# Patient Record
Sex: Female | Born: 1985 | Race: Black or African American | Hispanic: No | Marital: Single | Smoking: Former smoker
Health system: Southern US, Community
[De-identification: ages and names within clinical notes are randomized; demographics above are authoritative.]

## PROBLEM LIST (undated history)

## (undated) ENCOUNTER — Emergency Department (HOSPITAL_COMMUNITY): Admission: EM | Payer: Self-pay | Source: Home / Self Care

## (undated) ENCOUNTER — Inpatient Hospital Stay (HOSPITAL_COMMUNITY): Payer: Self-pay

## (undated) DIAGNOSIS — J45909 Unspecified asthma, uncomplicated: Secondary | ICD-10-CM

## (undated) DIAGNOSIS — F329 Major depressive disorder, single episode, unspecified: Secondary | ICD-10-CM

## (undated) DIAGNOSIS — F32A Depression, unspecified: Secondary | ICD-10-CM

## (undated) HISTORY — DX: Major depressive disorder, single episode, unspecified: F32.9

## (undated) HISTORY — DX: Depression, unspecified: F32.A

---

## 2006-09-26 HISTORY — PX: LESION REMOVAL: SHX5196

## 2014-02-24 DIAGNOSIS — T7491XA Unspecified adult maltreatment, confirmed, initial encounter: Secondary | ICD-10-CM | POA: Insufficient documentation

## 2015-12-14 DIAGNOSIS — Z2821 Immunization not carried out because of patient refusal: Secondary | ICD-10-CM | POA: Insufficient documentation

## 2017-04-07 LAB — OB RESULTS CONSOLE ANTIBODY SCREEN: Antibody Screen: NEGATIVE

## 2017-04-07 LAB — CYSTIC FIBROSIS DIAGNOSTIC STUDY: INTERPRETATION-CFDNA: NEGATIVE

## 2017-04-07 LAB — CULTURE, OB URINE: URINE CULTURE, OB: NO GROWTH

## 2017-04-07 LAB — OB RESULTS CONSOLE ABO/RH: RH Type: NEGATIVE

## 2017-04-07 LAB — OB RESULTS CONSOLE HEPATITIS B SURFACE ANTIGEN: Hepatitis B Surface Ag: NEGATIVE

## 2017-04-07 LAB — OB RESULTS CONSOLE RUBELLA ANTIBODY, IGM: RUBELLA: IMMUNE

## 2017-04-07 LAB — OB RESULTS CONSOLE VARICELLA ZOSTER ANTIBODY, IGG: Varicella: IMMUNE

## 2017-04-07 LAB — HIV ANTIBODY (ROUTINE TESTING W REFLEX): HIV Screen 4th Generation wRfx: NONREACTIVE

## 2017-04-20 LAB — OB RESULTS CONSOLE RPR: RPR: NONREACTIVE

## 2017-04-20 LAB — OB RESULTS CONSOLE GC/CHLAMYDIA
CHLAMYDIA, DNA PROBE: NEGATIVE
Gonorrhea: NEGATIVE

## 2017-04-20 LAB — CYTOLOGY - PAP: PAP SMEAR: NEGATIVE

## 2017-06-24 ENCOUNTER — Encounter (HOSPITAL_COMMUNITY): Payer: Self-pay

## 2017-06-24 ENCOUNTER — Inpatient Hospital Stay (HOSPITAL_COMMUNITY): Payer: Medicaid - Out of State

## 2017-06-24 ENCOUNTER — Inpatient Hospital Stay (HOSPITAL_COMMUNITY)
Admission: AD | Admit: 2017-06-24 | Discharge: 2017-06-24 | Disposition: A | Discharge status: Home / Self Care | Payer: Medicaid - Out of State | Source: Ambulatory Visit | Attending: Obstetrics & Gynecology | Admitting: Obstetrics & Gynecology

## 2017-06-24 DIAGNOSIS — N949 Unspecified condition associated with female genital organs and menstrual cycle: Secondary | ICD-10-CM

## 2017-06-24 DIAGNOSIS — R102 Pelvic and perineal pain: Secondary | ICD-10-CM | POA: Insufficient documentation

## 2017-06-24 DIAGNOSIS — O26899 Other specified pregnancy related conditions, unspecified trimester: Secondary | ICD-10-CM

## 2017-06-24 DIAGNOSIS — O9989 Other specified diseases and conditions complicating pregnancy, childbirth and the puerperium: Secondary | ICD-10-CM

## 2017-06-24 DIAGNOSIS — Z3A16 16 weeks gestation of pregnancy: Secondary | ICD-10-CM | POA: Diagnosis not present

## 2017-06-24 DIAGNOSIS — O26892 Other specified pregnancy related conditions, second trimester: Secondary | ICD-10-CM | POA: Insufficient documentation

## 2017-06-24 DIAGNOSIS — R109 Unspecified abdominal pain: Secondary | ICD-10-CM

## 2017-06-24 HISTORY — DX: Unspecified asthma, uncomplicated: J45.909

## 2017-06-24 LAB — URINALYSIS, ROUTINE W REFLEX MICROSCOPIC
Bacteria, UA: NONE SEEN
Bilirubin Urine: NEGATIVE
GLUCOSE, UA: NEGATIVE mg/dL
Ketones, ur: NEGATIVE mg/dL
NITRITE: NEGATIVE
PROTEIN: NEGATIVE mg/dL
SPECIFIC GRAVITY, URINE: 1.019 (ref 1.005–1.030)
pH: 5 (ref 5.0–8.0)

## 2017-06-24 LAB — WET PREP, GENITAL
CLUE CELLS WET PREP: NONE SEEN
SPERM: NONE SEEN
Trich, Wet Prep: NONE SEEN
Yeast Wet Prep HPF POC: NONE SEEN

## 2017-06-24 MED ORDER — ACETAMINOPHEN 500 MG PO TABS
1000.0000 mg | ORAL_TABLET | Freq: Once | ORAL | Status: AC
  Administered 2017-06-24: 1000 mg via ORAL
  Filled 2017-06-24: qty 2

## 2017-06-24 NOTE — Discharge Instructions (Signed)

## 2017-06-24 NOTE — MAU Provider Note (Signed)
History     CSN: [id number]  Arrival date and time: 06/24/17 0532   First Provider Initiated Contact with Patient 06/24/17 [phone]      Chief Complaint  Patient presents with  . Abdominal Pain  . Back Pain   HPI  Ms. Amy Mcpherson is a 31 y.o. [id number] at [redacted]w[redacted]d who presents to MAU today with complaint of lower abdominal pain. The patient states that she noted the pain off and on yesterday, but it became much worse early this morning when she got up to use the bathroom. She denies vaginal bleeding, discharge, UTI symptoms, N/V/D today. She has had constipation, but had a normal BM yesterday. She is already taking Flagyl for BV.    OB History    Gravida Para Term Preterm AB Living   SAB TAB Ectopic Multiple Live Births   1 2            Past Medical History:  Diagnosis Date  . Asthma     Past Surgical History:  Procedure Laterality Date  . LESION REMOVAL  2008    No family history on file.  Social History  Substance Use Topics  . Smoking status: Former Games developer  . Smokeless tobacco: Never Used  . Alcohol use No    Allergies: Not on File  Prescriptions Prior to Admission  Medication Sig Dispense Refill Last Dose  . metroNIDAZOLE (FLAGYL) 250 MG tablet Take 250 mg by mouth 3 (three) times daily.   06/23/2017 at Unknown time  . Prenatal Vit-Fe Fumarate-FA (PRENATAL MULTIVITAMIN) TABS tablet Take 1 tablet by mouth daily at 12 noon.   06/23/2017 at Unknown time    Review of Systems  Constitutional: Negative for fever.  Gastrointestinal: Positive for abdominal pain and constipation. Negative for diarrhea, nausea and vomiting.  Genitourinary: Negative for dysuria, frequency, urgency, vaginal bleeding and vaginal discharge.   Physical Exam   Blood pressure (!) 123/42, pulse 89, temperature 98.3 F (36.8 C), temperature source Oral, resp. rate 16, weight 181 lb 12 oz (82.4 kg), last menstrual period 02/26/2017, SpO2 99 %.  Physical Exam  Nursing note and  vitals reviewed. Constitutional: She is oriented to person, place, and time. She appears well-developed and well-nourished. No distress.  HENT:  Head: Normocephalic and atraumatic.  Cardiovascular: Normal rate.   Respiratory: Effort normal.  GI: Soft. She exhibits no distension and no mass. There is tenderness (very mild lower abdominal tenderness to palpation bilterally). There is no rebound and no guarding.  Genitourinary: Uterus is enlarged. Uterus is not tender. Cervix exhibits no motion tenderness. No bleeding in the vagina. Vaginal discharge (scant white) found.  Neurological: She is alert and oriented to person, place, and time.  Skin: Skin is warm and dry. No erythema.  Psychiatric: She has a normal mood and affect.  Dilation: Closed Effacement (%): Thick Cervical Position: Posterior Exam by:: Vonzella Nipple, PA-C   Results for orders placed or performed during the hospital encounter of 06/24/17 (from the past 24 hour(s))  Urinalysis, Routine w reflex microscopic     Status: Abnormal   Collection Time: 06/24/17  5:52 AM  Result Value Ref Range   Color, Urine YELLOW YELLOW   APPearance CLEAR CLEAR   Specific Gravity, Urine 1.019 1.005 - 1.030   pH 5.0 5.0 - 8.0   Glucose, UA NEGATIVE NEGATIVE mg/dL   Hgb urine dipstick SMALL (A) NEGATIVE   Bilirubin Urine NEGATIVE NEGATIVE   Ketones, ur  NEGATIVE NEGATIVE mg/dL   Protein, ur NEGATIVE NEGATIVE mg/dL   Nitrite NEGATIVE NEGATIVE   Leukocytes, UA TRACE (A) NEGATIVE   RBC / HPF 0-5 0 - 5 RBC/hpf   WBC, UA 0-5 0 - 5 WBC/hpf   Bacteria, UA NONE SEEN NONE SEEN   Squamous Epithelial / LPF 0-5 (A) NONE SEEN   Mucus PRESENT   Wet prep, genital     Status: Abnormal   Collection Time: 06/24/17  6:35 AM  Result Value Ref Range   Yeast Wet Prep HPF POC NONE SEEN NONE SEEN   Trich, Wet Prep NONE SEEN NONE SEEN   Clue Cells Wet Prep HPF POC NONE SEEN NONE SEEN   WBC, Wet Prep HPF POC FEW (A) NONE SEEN   Sperm NONE SEEN     MAU  Course  Procedures None  MDM FHR - 151 bpm UA, wet prep and GC/Chlmaydia today Limited OB US ordered 1 G Tylenol given in MAU  Assessment and Plan  A: [facility]UP at [redacted]w[redacted]d Round ligament pain   P:  Discharge home Tylenol PRN for pain recommended Discussed warm bath/shower, moderation of activity and abdomina binder for pain management Second trimester precautions discussed Patient advised to follow-up with [facility] to start prenatal care  Patient may return to MAU as needed or if her condition were to change or worsen  Vonzella Nipple, PA-C 06/24/2017, 7:28 AM

## 2017-06-24 NOTE — MAU Note (Addendum)
Abdominal pain and back pain for last two days. Pt got up to go to the bathroom around 0415 and said that she had pain so severe after getting up that she was unable to fall back asleep and it shot across her back and her stomach. Pt gives pain in abdomen prior to sharp pains a 7/10 and a 5/10 in back. After/ during sharp pains pain in abdomen goes up to 8/10 and 8/10 in back. The pain in intermittent. Pt from [place], looking for OB/GYN

## 2017-06-26 LAB — GC/CHLAMYDIA PROBE AMP (~~LOC~~) NOT AT ARMC
Chlamydia: NEGATIVE
Neisseria Gonorrhea: NEGATIVE

## 2017-07-13 ENCOUNTER — Ambulatory Visit (INDEPENDENT_AMBULATORY_CARE_PROVIDER_SITE_OTHER): Payer: Medicaid - Out of State | Admitting: Student

## 2017-07-13 ENCOUNTER — Encounter: Payer: Self-pay | Admitting: Student

## 2017-07-13 ENCOUNTER — Ambulatory Visit (INDEPENDENT_AMBULATORY_CARE_PROVIDER_SITE_OTHER): Payer: Self-pay | Admitting: Clinical

## 2017-07-13 VITALS — BP 107/41 | HR 93 | Ht 60.0 in | Wt 184.0 lb

## 2017-07-13 DIAGNOSIS — Z98891 History of uterine scar from previous surgery: Secondary | ICD-10-CM

## 2017-07-13 DIAGNOSIS — O26899 Other specified pregnancy related conditions, unspecified trimester: Secondary | ICD-10-CM

## 2017-07-13 DIAGNOSIS — O99342 Other mental disorders complicating pregnancy, second trimester: Secondary | ICD-10-CM

## 2017-07-13 DIAGNOSIS — F4323 Adjustment disorder with mixed anxiety and depressed mood: Secondary | ICD-10-CM

## 2017-07-13 DIAGNOSIS — Z23 Encounter for immunization: Secondary | ICD-10-CM

## 2017-07-13 DIAGNOSIS — O99512 Diseases of the respiratory system complicating pregnancy, second trimester: Secondary | ICD-10-CM

## 2017-07-13 DIAGNOSIS — J45909 Unspecified asthma, uncomplicated: Secondary | ICD-10-CM | POA: Insufficient documentation

## 2017-07-13 DIAGNOSIS — J4520 Mild intermittent asthma, uncomplicated: Secondary | ICD-10-CM

## 2017-07-13 DIAGNOSIS — Z3482 Encounter for supervision of other normal pregnancy, second trimester: Secondary | ICD-10-CM

## 2017-07-13 DIAGNOSIS — F32A Depression, unspecified: Secondary | ICD-10-CM

## 2017-07-13 DIAGNOSIS — F329 Major depressive disorder, single episode, unspecified: Secondary | ICD-10-CM

## 2017-07-13 DIAGNOSIS — Z6791 Unspecified blood type, Rh negative: Secondary | ICD-10-CM

## 2017-07-13 DIAGNOSIS — Z3480 Encounter for supervision of other normal pregnancy, unspecified trimester: Secondary | ICD-10-CM

## 2017-07-13 LAB — POCT URINALYSIS DIP (DEVICE)
BILIRUBIN URINE: NEGATIVE
Glucose, UA: NEGATIVE mg/dL
KETONES UR: NEGATIVE mg/dL
LEUKOCYTES UA: NEGATIVE
NITRITE: NEGATIVE
Protein, ur: NEGATIVE mg/dL
SPECIFIC GRAVITY, URINE: 1.02 (ref 1.005–1.030)
Urobilinogen, UA: 0.2 mg/dL (ref 0.0–1.0)
pH: 7 (ref 5.0–8.0)

## 2017-07-13 NOTE — Patient Instructions (Signed)

## 2017-07-13 NOTE — BH Specialist Note (Signed)
Integrated Behavioral Health Initial Visit  MRN: [id number] Name: Amy Mcpherson  Number of Integrated Behavioral Health Clinician visits:: 1/6 Session Start time: 3:00  Session End time: 3:18 Total time: 20 minutes  Type of Service: Integrated Behavioral Health- Individual/Family Interpretor:No. Interpretor Name and Language: n/a   Warm Hand Off Completed.       SUBJECTIVE: Amy Mcpherson is a 31 y.o. female accompanied by n/a Patient was referred by Luna KitchensKathryn Kooistra, CNM for symptoms of anxiety and depression. Patient reports the following symptoms/concerns: Pt states her primary concern today is an increase in stress/depression/anxiety, while adjusting to move to [place] while pregnant; pt goal is to be prepared for children to move to [place] with her Duration of problem: Three weeks; Severity of problem: severe  OBJECTIVE: Mood: Appropriate and Affect: Appropriate Risk of harm to self or others: No plan to harm self or others  LIFE CONTEXT: Family and Social: Lives with FOB and friend; [place] and [place] in [place]/Work: Working as LawyerCNA; aspires to further educational in the future Self-Care: - Life Changes: Current pregnancy, recent move to [place], looking for permanent housing  GOALS ADDRESSED: Patient will: 1. Reduce symptoms of: anxiety, depression and stress 2. Increase knowledge and/or ability of: stress reduction  3. Demonstrate ability to: Increase healthy adjustment to current life circumstances and Increase adequate support systems for patient/family  INTERVENTIONS: Interventions utilized: Solution-Focused Strategies, Psychoeducation and/or Health Education and Link to [facility]  Standardized Assessments completed: GAD-7 and PHQ 9  ASSESSMENT: Patient currently experiencing Adjustment disorder with mixed anxious and depressed mood.   Patient may benefit from psychoeducation and brief therapeutic intervention regarding coping with symptoms  of anxiety and depression.  PLAN: 1. Follow up with behavioral health clinician on : One month 2. Behavioral recommendations:  -Consider community resources available in local area, as discussed in office visit -Read educational material regarding coping with symptoms of anxiety, depression, and BH medication -Consider apps for daily moments of self-care -Read educational material regarding coping with symptoms of anxiety and depression  3. Referral(s): Integrated Art gallery managerBehavioral Health Services (In Clinic) and [employer] Resources:  Housing and [facility] 4. "From scale of 1-10, how likely are you to follow plan?": 9 5.   Valetta CloseJamie C Damain Broadus, LCSWA  Depression screen [facility] 07/13/2017  Decreased Interest 2  Down, Depressed, Hopeless 3  PHQ - 2 Score 5  Altered sleeping 1  Tired, decreased energy 3  Change in appetite 2  Feeling bad or failure about yourself  3  Trouble concentrating 0  Moving slowly or fidgety/restless 0  Suicidal thoughts 0  PHQ-9 Score 14   GAD 7 : Generalized Anxiety Score 07/13/2017  Nervous, Anxious, on Edge 2  Control/stop worrying 3  Worry too much - different things 3  Trouble relaxing 3  Restless 0  Easily annoyed or irritable 3  Afraid - awful might happen 3  Total GAD 7 Score 17

## 2017-07-13 NOTE — Progress Notes (Signed)
  Subjective:    Amy Mcpherson is being seen today for her first obstetrical visit.  This is not a planned pregnancy. She is at 2887w4d gestation. Her obstetrical history is significant for c-section x two. . Relationship with FOB: significant other, living together. Patient does intend to breast feed. Pregnancy history fully reviewed.  Patient reports no complaints.  Review of Systems:   Review of Systems  HENT: Negative.   Respiratory: Negative.   Cardiovascular: Negative.   Genitourinary: Negative.   Neurological: Negative.     Objective:     BP (!) 107/41   Pulse 93   Ht 5' (1.524 m)   Wt 184 lb (83.5 kg)   LMP 02/26/2017 (Exact Date)   BMI 35.94 kg/m  Physical Exam  Constitutional: She is oriented to person, place, and time. She appears well-developed and well-nourished.  HENT:  Head: Normocephalic.  Neck: Normal range of motion.  Cardiovascular: Normal rate.   Respiratory: Effort normal.  GI: Soft.  Musculoskeletal: Normal range of motion.  Neurological: She is alert and oriented to person, place, and time.  Skin: Skin is warm and dry.  Psychiatric: She has a normal mood and affect.    Exam    Assessment:    Pregnancy: [id number] Patient Active Problem List   Diagnosis Date Noted  . Supervision of other normal pregnancy, antepartum 07/13/2017  . History of C-section 07/13/2017  . Asthma 07/13/2017  . Rh negative state in antepartum period 07/13/2017  . Depression 07/13/2017       Plan:     Initial labs drawn. Prenatal vitamins. Problem list reviewed and updated. AFP3 discussed: declined. Role of ultrasound in pregnancy discussed; fetal survey: ordered. Amniocentesis discussed: not indicated. Follow up in 4 weeks. 50% of 40 min visit spent on counseling and coordination of care.    -Patient to see Hulda MarinJamie McMannes after visit; patient declines medication for depression at this time -Patient desires VBAC; will sign consent at next visit.  -patient  signed ROI; will hold off on initial labs and cultures until labs are received.  -MFM US ordered  Charlesetta GaribaldiKathryn [facility] 07/13/2017

## 2017-07-13 NOTE — Progress Notes (Signed)
Patient verbally consented to meet [facility] about presenting concerns. Luna KitchensKathryn Kooistra aware

## 2017-07-21 ENCOUNTER — Ambulatory Visit (HOSPITAL_COMMUNITY)
Admission: RE | Admit: 2017-07-21 | Discharge: 2017-07-21 | Disposition: A | Discharge status: Home / Self Care | Payer: Medicaid - Out of State | Source: Ambulatory Visit | Attending: Student | Admitting: Student

## 2017-07-21 DIAGNOSIS — Z3480 Encounter for supervision of other normal pregnancy, unspecified trimester: Secondary | ICD-10-CM | POA: Diagnosis present

## 2017-07-21 DIAGNOSIS — Z3A20 20 weeks gestation of pregnancy: Secondary | ICD-10-CM | POA: Diagnosis not present

## 2017-07-21 DIAGNOSIS — O34219 Maternal care for unspecified type scar from previous cesarean delivery: Secondary | ICD-10-CM | POA: Diagnosis not present

## 2017-07-21 DIAGNOSIS — Z3689 Encounter for other specified antenatal screening: Secondary | ICD-10-CM | POA: Insufficient documentation

## 2017-07-21 DIAGNOSIS — Z3482 Encounter for supervision of other normal pregnancy, second trimester: Secondary | ICD-10-CM | POA: Insufficient documentation

## 2017-08-10 ENCOUNTER — Ambulatory Visit (INDEPENDENT_AMBULATORY_CARE_PROVIDER_SITE_OTHER): Payer: Medicaid - Out of State | Admitting: Family Medicine

## 2017-08-10 ENCOUNTER — Ambulatory Visit (INDEPENDENT_AMBULATORY_CARE_PROVIDER_SITE_OTHER): Payer: Self-pay | Admitting: Clinical

## 2017-08-10 VITALS — BP 101/45 | HR 100 | Wt 190.4 lb

## 2017-08-10 DIAGNOSIS — Z98891 History of uterine scar from previous surgery: Secondary | ICD-10-CM

## 2017-08-10 DIAGNOSIS — Z3480 Encounter for supervision of other normal pregnancy, unspecified trimester: Secondary | ICD-10-CM | POA: Diagnosis not present

## 2017-08-10 DIAGNOSIS — F4323 Adjustment disorder with mixed anxiety and depressed mood: Secondary | ICD-10-CM

## 2017-08-10 NOTE — Progress Notes (Signed)
Called previous provider for records.   They did not receive records- will refax records.

## 2017-08-10 NOTE — Patient Instructions (Signed)
 Second Trimester of Pregnancy The second trimester is from week 14 through week 27 (months 4 through 6). The second trimester is often a time when you feel your best. Your body has adjusted to being pregnant, and you begin to feel better physically. Usually, morning sickness has lessened or quit completely, you may have more energy, and you may have an increase in appetite. The second trimester is also a time when the fetus is growing rapidly. At the end of the sixth month, the fetus is about 9 inches long and weighs about 1 pounds. You will likely begin to feel the baby move (quickening) between 16 and 20 weeks of pregnancy. Body changes during your second trimester Your body continues to go through many changes during your second trimester. The changes vary from woman to woman.  Your weight will continue to increase. You will notice your lower abdomen bulging out.  You may begin to get stretch marks on your hips, abdomen, and breasts.  You may develop headaches that can be relieved by medicines. The medicines should be approved by your health care provider.  You may urinate more often because the fetus is pressing on your bladder.  You may develop or continue to have heartburn as a result of your pregnancy.  You may develop constipation because certain hormones are causing the muscles that push waste through your intestines to slow down.  You may develop hemorrhoids or swollen, bulging veins (varicose veins).  You may have back pain. This is caused by: ? Weight gain. ? Pregnancy hormones that are relaxing the joints in your pelvis. ? A shift in weight and the muscles that support your balance.  Your breasts will continue to grow and they will continue to become tender.  Your gums may bleed and may be sensitive to brushing and flossing.  Dark spots or blotches (chloasma, mask of pregnancy) may develop on your face. This will likely fade after the baby is born.  A dark line from  your belly button to the pubic area (linea nigra) may appear. This will likely fade after the baby is born.  You may have changes in your hair. These can include thickening of your hair, rapid growth, and changes in texture. Some women also have hair loss during or after pregnancy, or hair that feels dry or thin. Your hair will most likely return to normal after your baby is born.  What to expect at prenatal visits During a routine prenatal visit:  You will be weighed to make sure you and the fetus are growing normally.  Your blood pressure will be taken.  Your abdomen will be measured to track your baby's growth.  The fetal heartbeat will be listened to.  Any test results from the previous visit will be discussed.  Your health care provider may ask you:  How you are feeling.  If you are feeling the baby move.  If you have had any abnormal symptoms, such as leaking fluid, bleeding, severe headaches, or abdominal cramping.  If you are using any tobacco products, including cigarettes, chewing tobacco, and electronic cigarettes.  If you have any questions.  Other tests that may be performed during your second trimester include:  Blood tests that check for: ? Low iron levels (anemia). ? High blood sugar that affects pregnant women (gestational diabetes) between 24 and 28 weeks. ? Rh antibodies. This is to check for a protein on red blood cells (Rh factor).  Urine tests to check for infections, diabetes,   or protein in the urine.  An ultrasound to confirm the proper growth and development of the baby.  An amniocentesis to check for possible genetic problems.  Fetal screens for spina bifida and Down syndrome.  HIV (human immunodeficiency virus) testing. Routine prenatal testing includes screening for HIV, unless you choose not to have this test.  Follow these instructions at home: Medicines  Follow your health care provider's instructions regarding medicine use. Specific  medicines may be either safe or unsafe to take during pregnancy.  Take a prenatal vitamin that contains at least 600 micrograms (mcg) of folic acid.  If you develop constipation, try taking a stool softener if your health care provider approves. Eating and drinking  Eat a balanced diet that includes fresh fruits and vegetables, whole grains, good sources of protein such as meat, eggs, or tofu, and low-fat dairy. Your health care provider will help you determine the amount of weight gain that is right for you.  Avoid raw meat and uncooked cheese. These carry germs that can cause birth defects in the baby.  If you have low calcium intake from food, talk to your health care provider about whether you should take a daily calcium supplement.  Limit foods that are high in fat and processed sugars, such as fried and sweet foods.  To prevent constipation: ? Drink enough fluid to keep your urine clear or pale yellow. ? Eat foods that are high in fiber, such as fresh fruits and vegetables, whole grains, and beans. Activity  Exercise only as directed by your health care provider. Most women can continue their usual exercise routine during pregnancy. Try to exercise for 30 minutes at least 5 days a week. Stop exercising if you experience uterine contractions.  Avoid heavy lifting, wear low heel shoes, and practice good posture.  A sexual relationship may be continued unless your health care provider directs you otherwise. Relieving pain and discomfort  Wear a good support bra to prevent discomfort from breast tenderness.  Take warm sitz baths to soothe any pain or discomfort caused by hemorrhoids. Use hemorrhoid cream if your health care provider approves.  Rest with your legs elevated if you have leg cramps or low back pain.  If you develop varicose veins, wear support hose. Elevate your feet for 15 minutes, 3-4 times a day. Limit salt in your diet. Prenatal Care  Write down your questions.  Take them to your prenatal visits.  Keep all your prenatal visits as told by your health care provider. This is important. Safety  Wear your seat belt at all times when driving.  Make a list of emergency phone numbers, including numbers for family, friends, the hospital, and police and fire departments. General instructions  Ask your health care provider for a referral to a local prenatal education class. Begin classes no later than the beginning of month 6 of your pregnancy.  Ask for help if you have counseling or nutritional needs during pregnancy. Your health care provider can offer advice or refer you to specialists for help with various needs.  Do not use hot tubs, steam rooms, or saunas.  Do not douche or use tampons or scented sanitary pads.  Do not cross your legs for long periods of time.  Avoid cat litter boxes and soil used by cats. These carry germs that can cause birth defects in the baby and possibly loss of the fetus by miscarriage or stillbirth.  Avoid all smoking, herbs, alcohol, and unprescribed drugs. Chemicals in these products   can affect the formation and growth of the baby.  Do not use any products that contain nicotine or tobacco, such as cigarettes and e-cigarettes. If you need help quitting, ask your health care provider.  Visit your dentist if you have not gone yet during your pregnancy. Use a soft toothbrush to brush your teeth and be gentle when you floss. Contact a health care provider if:  You have dizziness.  You have mild pelvic cramps, pelvic pressure, or nagging pain in the abdominal area.  You have persistent nausea, vomiting, or diarrhea.  You have a bad smelling vaginal discharge.  You have pain when you urinate. Get help right away if:  You have a fever.  You are leaking fluid from your vagina.  You have spotting or bleeding from your vagina.  You have severe abdominal cramping or pain.  You have rapid weight gain or weight  loss.  You have shortness of breath with chest pain.  You notice sudden or extreme swelling of your face, hands, ankles, feet, or legs.  You have not felt your baby move in over an hour.  You have severe headaches that do not go away when you take medicine.  You have vision changes. Summary  The second trimester is from week 14 through week 27 (months 4 through 6). It is also a time when the fetus is growing rapidly.  Your body goes through many changes during pregnancy. The changes vary from woman to woman.  Avoid all smoking, herbs, alcohol, and unprescribed drugs. These chemicals affect the formation and growth your baby.  Do not use any tobacco products, such as cigarettes, chewing tobacco, and e-cigarettes. If you need help quitting, ask your health care provider.  Contact your health care provider if you have any questions. Keep all prenatal visits as told by your health care provider. This is important. This information is not intended to replace advice given to you by your health care provider. Make sure you discuss any questions you have with your health care provider. Document Released: 09/06/2001 Document Revised: 02/18/2016 Document Reviewed: 11/13/2012 [employer] Interactive Patient Education  2017 [employer]   Breastfeeding Deciding to breastfeed is one of the best choices you can make for you and your baby. A change in hormones during pregnancy causes your breast tissue to grow and increases the number and size of your milk ducts. These hormones also allow proteins, sugars, and fats from your blood supply to make breast milk in your milk-producing glands. Hormones prevent breast milk from being released before your baby is born as well as prompt milk flow after birth. Once breastfeeding has begun, thoughts of your baby, as well as his or her sucking or crying, can stimulate the release of milk from your milk-producing glands. Benefits of breastfeeding For Your  Baby  Your first milk (colostrum) helps your baby's digestive system function better.  There are antibodies in your milk that help your baby fight off infections.  Your baby has a lower incidence of asthma, allergies, and sudden infant death syndrome.  The nutrients in breast milk are better for your baby than infant formulas and are designed uniquely for your baby's needs.  Breast milk improves your baby's brain development.  Your baby is less likely to develop other conditions, such as childhood obesity, asthma, or type 2 diabetes mellitus.  For You  Breastfeeding helps to create a very special bond between you and your baby.  Breastfeeding is convenient. Breast milk is always available at   the correct temperature and costs nothing.  Breastfeeding helps to burn calories and helps you lose the weight gained during pregnancy.  Breastfeeding makes your uterus contract to its prepregnancy size faster and slows bleeding (lochia) after you give birth.  Breastfeeding helps to lower your risk of developing type 2 diabetes mellitus, osteoporosis, and breast or ovarian cancer later in life.  Signs that your baby is hungry Early Signs of Hunger  Increased alertness or activity.  Stretching.  Movement of the head from side to side.  Movement of the head and opening of the mouth when the corner of the mouth or cheek is stroked (rooting).  Increased sucking sounds, smacking lips, cooing, sighing, or squeaking.  Hand-to-mouth movements.  Increased sucking of fingers or hands.  Late Signs of Hunger  Fussing.  Intermittent crying.  Extreme Signs of Hunger Signs of extreme hunger will require calming and consoling before your baby will be able to breastfeed successfully. Do not wait for the following signs of extreme hunger to occur before you initiate breastfeeding:  Restlessness.  A loud, strong cry.  Screaming.  Breastfeeding basics Breastfeeding Initiation  Find a  comfortable place to sit or lie down, with your neck and back well supported.  Place a pillow or rolled up blanket under your baby to bring him or her to the level of your breast (if you are seated). Nursing pillows are specially designed to help support your arms and your baby while you breastfeed.  Make sure that your baby's abdomen is facing your abdomen.  Gently massage your breast. With your fingertips, massage from your chest wall toward your nipple in a circular motion. This encourages milk flow. You may need to continue this action during the feeding if your milk flows slowly.  Support your breast with 4 fingers underneath and your thumb above your nipple. Make sure your fingers are well away from your nipple and your baby's mouth.  Stroke your baby's lips gently with your finger or nipple.  When your baby's mouth is open wide enough, quickly bring your baby to your breast, placing your entire nipple and as much of the colored area around your nipple (areola) as possible into your baby's mouth. ? More areola should be visible above your baby's upper lip than below the lower lip. ? Your baby's tongue should be between his or her lower gum and your breast.  Ensure that your baby's mouth is correctly positioned around your nipple (latched). Your baby's lips should create a seal on your breast and be turned out (everted).  It is common for your baby to suck about 2-3 minutes in order to start the flow of breast milk.  Latching Teaching your baby how to latch on to your breast properly is very important. An improper latch can cause nipple pain and decreased milk supply for you and poor weight gain in your baby. Also, if your baby is not latched onto your nipple properly, he or she may swallow some air during feeding. This can make your baby fussy. Burping your baby when you switch breasts during the feeding can help to get rid of the air. However, teaching your baby to latch on properly is  still the best way to prevent fussiness from swallowing air while breastfeeding. Signs that your baby has successfully latched on to your nipple:  Silent tugging or silent sucking, without causing you pain.  Swallowing heard between every 3-4 sucks.  Muscle movement above and in front of his or her   ears while sucking.  Signs that your baby has not successfully latched on to nipple:  Sucking sounds or smacking sounds from your baby while breastfeeding.  Nipple pain.  If you think your baby has not latched on correctly, slip your finger into the corner of your baby's mouth to break the suction and place it between your baby's gums. Attempt breastfeeding initiation again. Signs of Successful Breastfeeding Signs from your baby:  A gradual decrease in the number of sucks or complete cessation of sucking.  Falling asleep.  Relaxation of his or her body.  Retention of a small amount of milk in his or her mouth.  Letting go of your breast by himself or herself.  Signs from you:  Breasts that have increased in firmness, weight, and size 1-3 hours after feeding.  Breasts that are softer immediately after breastfeeding.  Increased milk volume, as well as a change in milk consistency and color by the fifth day of breastfeeding.  Nipples that are not sore, cracked, or bleeding.  Signs That Your Baby is Getting Enough Milk  Wetting at least 1-2 diapers during the first 24 hours after birth.  Wetting at least 5-6 diapers every 24 hours for the first week after birth. The urine should be clear or pale yellow by 5 days after birth.  Wetting 6-8 diapers every 24 hours as your baby continues to grow and develop.  At least 3 stools in a 24-hour period by age 5 days. The stool should be soft and yellow.  At least 3 stools in a 24-hour period by age 7 days. The stool should be seedy and yellow.  No loss of weight greater than 10% of birth weight during the first 3 days of age.  Average  weight gain of 4-7 ounces (113-198 g) per week after age 4 days.  Consistent daily weight gain by age 5 days, without weight loss after the age of 2 weeks.  After a feeding, your baby may spit up a small amount. This is common. Breastfeeding frequency and duration Frequent feeding will help you make more milk and can prevent sore nipples and breast engorgement. Breastfeed when you feel the need to reduce the fullness of your breasts or when your baby shows signs of hunger. This is called "breastfeeding on demand." Avoid introducing a pacifier to your baby while you are working to establish breastfeeding (the first 4-6 weeks after your baby is born). After this time you may choose to use a pacifier. Research has shown that pacifier use during the first year of a baby's life decreases the risk of sudden infant death syndrome (SIDS). Allow your baby to feed on each breast as long as he or she wants. Breastfeed until your baby is finished feeding. When your baby unlatches or falls asleep while feeding from the first breast, offer the second breast. Because newborns are often sleepy in the first few weeks of life, you may need to awaken your baby to get him or her to feed. Breastfeeding times will vary from baby to baby. However, the following rules can serve as a guide to help you ensure that your baby is properly fed:  Newborns (babies 4 weeks of age or younger) may breastfeed every 1-3 hours.  Newborns should not go longer than 3 hours during the day or 5 hours during the night without breastfeeding.  You should breastfeed your baby a minimum of 8 times in a 24-hour period until you begin to introduce solid foods to your   baby at around 6 months of age.  Breast milk pumping Pumping and storing breast milk allows you to ensure that your baby is exclusively fed your breast milk, even at times when you are unable to breastfeed. This is especially important if you are going back to work while you are still  breastfeeding or when you are not able to be present during feedings. Your lactation consultant can give you guidelines on how long it is safe to store breast milk. A breast pump is a machine that allows you to pump milk from your breast into a sterile bottle. The pumped breast milk can then be stored in a refrigerator or freezer. Some breast pumps are operated by hand, while others use electricity. Ask your lactation consultant which type will work best for you. Breast pumps can be purchased, but some hospitals and breastfeeding support groups lease breast pumps on a monthly basis. A lactation consultant can teach you how to hand express breast milk, if you prefer not to use a pump. Caring for your breasts while you breastfeed Nipples can become dry, cracked, and sore while breastfeeding. The following recommendations can help keep your breasts moisturized and healthy:  Avoid using soap on your nipples.  Wear a supportive bra. Although not required, special nursing bras and tank tops are designed to allow access to your breasts for breastfeeding without taking off your entire bra or top. Avoid wearing underwire-style bras or extremely tight bras.  Air dry your nipples for 3-4minutes after each feeding.  Use only cotton bra pads to absorb leaked breast milk. Leaking of breast milk between feedings is normal.  Use lanolin on your nipples after breastfeeding. Lanolin helps to maintain your skin's normal moisture barrier. If you use pure lanolin, you do not need to wash it off before feeding your baby again. Pure lanolin is not toxic to your baby. You may also hand express a few drops of breast milk and gently massage that milk into your nipples and allow the milk to air dry.  In the first few weeks after giving birth, some women experience extremely full breasts (engorgement). Engorgement can make your breasts feel heavy, warm, and tender to the touch. Engorgement peaks within 3-5 days after you give  birth. The following recommendations can help ease engorgement:  Completely empty your breasts while breastfeeding or pumping. You may want to start by applying warm, moist heat (in the shower or with warm water-soaked hand towels) just before feeding or pumping. This increases circulation and helps the milk flow. If your baby does not completely empty your breasts while breastfeeding, pump any extra milk after he or she is finished.  Wear a snug bra (nursing or regular) or tank top for 1-2 days to signal your body to slightly decrease milk production.  Apply ice packs to your breasts, unless this is too uncomfortable for you.  Make sure that your baby is latched on and positioned properly while breastfeeding.  If engorgement persists after 48 hours of following these recommendations, contact your health care provider or a lactation consultant. Overall health care recommendations while breastfeeding  Eat healthy foods. Alternate between meals and snacks, eating 3 of each per day. Because what you eat affects your breast milk, some of the foods may make your baby more irritable than usual. Avoid eating these foods if you are sure that they are negatively affecting your baby.  Drink milk, fruit juice, and water to satisfy your thirst (about 10 glasses a day).    Rest often, relax, and continue to take your prenatal vitamins to prevent fatigue, stress, and anemia.  Continue breast self-awareness checks.  Avoid chewing and smoking tobacco. Chemicals from cigarettes that pass into breast milk and exposure to secondhand smoke may harm your baby.  Avoid alcohol and drug use, including marijuana. Some medicines that may be harmful to your baby can pass through breast milk. It is important to ask your health care provider before taking any medicine, including all over-the-counter and prescription medicine as well as vitamin and herbal supplements. It is possible to become pregnant while breastfeeding.  If birth control is desired, ask your health care provider about options that will be safe for your baby. Contact a health care provider if:  You feel like you want to stop breastfeeding or have become frustrated with breastfeeding.  You have painful breasts or nipples.  Your nipples are cracked or bleeding.  Your breasts are red, tender, or warm.  You have a swollen area on either breast.  You have a fever or chills.  You have nausea or vomiting.  You have drainage other than breast milk from your nipples.  Your breasts do not become full before feedings by the fifth day after you give birth.  You feel sad and depressed.  Your baby is too sleepy to eat well.  Your baby is having trouble sleeping.  Your baby is wetting less than 3 diapers in a 24-hour period.  Your baby has less than 3 stools in a 24-hour period.  Your baby's skin or the white part of his or her eyes becomes yellow.  Your baby is not gaining weight by 5 days of age. Get help right away if:  Your baby is overly tired (lethargic) and does not want to wake up and feed.  Your baby develops an unexplained fever. This information is not intended to replace advice given to you by your health care provider. Make sure you discuss any questions you have with your health care provider. Document Released: 09/12/2005 Document Revised: 02/24/2016 Document Reviewed: 03/06/2013 [employer] Interactive Patient Education  2017 [employer]  

## 2017-08-10 NOTE — BH Specialist Note (Signed)
Integrated Behavioral Health Follow Up Visit  MRN: [id number] Name: Amy Congonjelissa Higbie  Number of Integrated Behavioral Health Clinician visits: 2/6 Session Start time: 4:20 Session End time: 4:50 Total time: 30 minutes  Type of Service: Integrated Behavioral Health- Individual/Family Interpretor:No. Interpretor Name and Language: n/a  SUBJECTIVE: Amy Mcpherson is a 31 y.o. female accompanied by n/a Patient was referred by Dr Crisoforo OxfordKooistra for symptoms of anxiety and depression. Patient reports the following symptoms/concerns: Pt states her primary concern today is feeling depressed without her children here, and anxious over not being in her new home yet. Pt says it helps to talk about how she is feeling. Duration of problem: Less than two months ; Severity of problem: moderate   OBJECTIVE: Mood: Appropriate and Affect: Appropriate Risk of harm to self or others: No plan to harm self or others   LIFE CONTEXT: Family and Social: Lives with FOB and friend; will be moving into new home within two weeks, and children will be moving to [place] with her within one month School/Work: Unemployed, job loss. Pt will look for work after children move to [place], and  plans to further education in the future  Self-Care: - Life Changes: Current pregnancy, move to [place], found a home to move into within 2 weeks. Most recent change, job loss.  GOALS ADDRESSED: Patient will: 1.  Reduce symptoms of: anxiety, depression and stress  2.  Demonstrate ability to: Increase healthy adjustment to current life circumstances  INTERVENTIONS: Interventions utilized:  Mindfulness or Management consultantelaxation Training and Psychoeducation and/or Health Education Standardized Assessments completed: GAD-7 and PHQ 9  ASSESSMENT: Patient currently experiencing Adjustment disorder with mixed anxious and depressed mood.   Patient may benefit from continued brief therapeutic intervention regarding coping with symptoms of anxiety and  depression.  PLAN: 1. Follow up with behavioral health clinician on : One month  2. Behavioral recommendations:  -Consider CALM relaxation breathing exercise daily -Continue with plans to move into new home -Continue video chatting with children daily 3. Referral(s[facility] 4. "From scale of 1-10, how likely are you to follow plan?": 9  Rae LipsJamie C Gelsey Amyx, LCSWA  Dep[facility] 08/10/2017 07/13/2017  Decreased Interest 2 2  Down, Depressed, Hopeless 2 3  PHQ - 2 Score 4 5  Altered sleeping 3 1  Tired, decreased energy 2 3  Change in appetite 3 2  Feeling bad or failure about yourself  2 3  Trouble concentrating 0 0  Moving slowly or fidgety/restless 0 0  Suicidal thoughts 0 0  PHQ-9 Score 14 14   GAD 7 : Generalized Anxiety Score 08/10/2017 07/13/2017  Nervous, Anxious, on Edge 2 2  Control/stop worrying 3 3  Worry too much - different things 3 3  Trouble relaxing 3 3  Restless 0 0  Easily annoyed or irritable 2 3  Afraid - awful might happen 3 3  Total GAD 7 Score 16 17

## 2017-08-10 NOTE — Progress Notes (Signed)
Patient and/or legal guardian verbally consented to meet with Behavioral Health Clinician about presenting concerns.Already had appointment scheduled.

## 2017-08-10 NOTE — Progress Notes (Signed)
   PRENATAL VISIT NOTE  Subjective:  Amy Mcpherson is a 31 y.o. [id number] at 952w4d being seen today for ongoing prenatal care.  She is currently monitored for the following issues for this low-risk pregnancy and has Supervision of other normal pregnancy, antepartum; History of C-section x 2; Asthma; Rh negative state in antepartum period; and Depression on their problem list.  Patient reports no complaints.  Contractions: Not present. Vag. Bleeding: None.  Movement: Present. Denies leaking of fluid.   The following portions of the patient's history were reviewed and updated as appropriate: allergies, current medications, past family history, past medical history, past social history, past surgical history and problem list. Problem list updated.  Objective:   Vitals:   08/10/17 1445  BP: (!) 101/45  Pulse: 100  Weight: 190 lb 6.4 oz (86.4 kg)    Fetal Status: Fetal Heart Rate (bpm): 160   Movement: Present     General:  Alert, oriented and cooperative. Patient is in no acute distress.  Skin: Skin is warm and dry. No rash noted.   Cardiovascular: Normal heart rate noted  Respiratory: Normal respiratory effort, no problems with respiration noted  Abdomen: Soft, gravid, appropriate for gestational age.  Pain/Pressure: Present     Pelvic: Cervical exam deferred        Extremities: Normal range of motion.  Edema: Trace  Mental Status:  Normal mood and affect. Normal behavior. Normal judgment and thought content.   Assessment and Plan:  Pregnancy: [id number] at 6252w4d  1. Supervision of other normal pregnancy, antepartum Continue routine prenatal care.   2. History of C-section x 2 Desires RCS now--booked for 39 wks  Preterm labor symptoms and general obstetric precautions including but not limited to vaginal bleeding, contractions, leaking of fluid and fetal movement were reviewed in detail with the patient. Please refer to After Visit Summary for other counseling recommendations.    Return in 4 weeks (on 09/07/2017) for 28 wk labs, Rhogam, TDaP.   Reva Boresanya S Cabe Lashley, MD

## 2017-09-06 ENCOUNTER — Encounter: Payer: Self-pay | Admitting: Obstetrics and Gynecology

## 2017-09-06 ENCOUNTER — Ambulatory Visit (INDEPENDENT_AMBULATORY_CARE_PROVIDER_SITE_OTHER): Payer: Medicaid Other | Admitting: Obstetrics and Gynecology

## 2017-09-06 VITALS — BP 99/66 | HR 89 | Wt 189.5 lb

## 2017-09-06 DIAGNOSIS — Z3480 Encounter for supervision of other normal pregnancy, unspecified trimester: Secondary | ICD-10-CM

## 2017-09-06 DIAGNOSIS — Z98891 History of uterine scar from previous surgery: Secondary | ICD-10-CM

## 2017-09-06 DIAGNOSIS — Z3482 Encounter for supervision of other normal pregnancy, second trimester: Secondary | ICD-10-CM

## 2017-09-06 DIAGNOSIS — Z23 Encounter for immunization: Secondary | ICD-10-CM | POA: Diagnosis not present

## 2017-09-06 DIAGNOSIS — O26899 Other specified pregnancy related conditions, unspecified trimester: Secondary | ICD-10-CM

## 2017-09-06 DIAGNOSIS — O34219 Maternal care for unspecified type scar from previous cesarean delivery: Secondary | ICD-10-CM

## 2017-09-06 DIAGNOSIS — Z6791 Unspecified blood type, Rh negative: Secondary | ICD-10-CM | POA: Diagnosis not present

## 2017-09-06 MED ORDER — RHO D IMMUNE GLOBULIN 1500 UNIT/2ML IJ SOSY
300.0000 ug | PREFILLED_SYRINGE | Freq: Once | INTRAMUSCULAR | Status: AC
  Administered 2017-09-06: 300 ug via INTRAMUSCULAR

## 2017-09-06 MED ORDER — TETANUS-DIPHTH-ACELL PERTUSSIS 5-2.5-18.5 LF-MCG/0.5 IM SUSP
0.5000 mL | Freq: Once | INTRAMUSCULAR | Status: AC
  Administered 2017-09-06: 0.5 mL via INTRAMUSCULAR

## 2017-09-06 NOTE — Progress Notes (Signed)
28 wk packet given  Tdap/rhogam given

## 2017-09-06 NOTE — Addendum Note (Signed)
Addended by: Faythe CasaBELLAMY, Subhan Hoopes M on: 09/06/2017 09:56 AM   Modules accepted: Orders

## 2017-09-06 NOTE — Progress Notes (Signed)
   PRENATAL VISIT NOTE  Subjective:  Amy Mcpherson is a 31 y.o. [id number] at 1482w3d being seen today for ongoing prenatal care.  She is currently monitored for the following issues for this low-risk pregnancy and has Supervision of other normal pregnancy, antepartum; History of C-section x 2; Asthma; Rh negative state in antepartum period; and Depression on their problem list.  Patient reports no complaints.  Contractions: Irritability. Vag. Bleeding: None.  Movement: Present. Denies leaking of fluid.   The following portions of the patient's history were reviewed and updated as appropriate: allergies, current medications, past family history, past medical history, past social history, past surgical history and problem list. Problem list updated.  Objective:   Vitals:   09/06/17 0902  BP: 99/66  Pulse: 89  Weight: 189 lb 8 oz (86 kg)    Fetal Status: Fetal Heart Rate (bpm): 144   Movement: Present     General:  Alert, oriented and cooperative. Patient is in no acute distress.  Skin: Skin is warm and dry. No rash noted.   Cardiovascular: Normal heart rate noted  Respiratory: Normal respiratory effort, no problems with respiration noted  Abdomen: Soft, gravid, appropriate for gestational age.  Pain/Pressure: Present     Pelvic: Cervical exam deferred        Extremities: Normal range of motion.  Edema: Trace  Mental Status:  Normal mood and affect. Normal behavior. Normal judgment and thought content.   Assessment and Plan:  Pregnancy: [id number] at 4082w3d  1. Supervision of other normal pregnancy, antepartum Patient is doing well without complaints Glucola, third trimester labs and tdap  2. History of C-section x 2 Previous c-section x 2  3. Rh negative state in antepartum period Rhogam today  Preterm labor symptoms and general obstetric precautions including but not limited to vaginal bleeding, contractions, leaking of fluid and fetal movement were reviewed in detail with the  patient. Please refer to After Visit Summary for other counseling recommendations.  No Follow-up on file.   Catalina AntiguaPeggy Tyreik Delahoussaye, MD

## 2017-09-07 LAB — CBC
HEMATOCRIT: 31.4 % — AB (ref 34.0–46.6)
HEMOGLOBIN: 10.8 g/dL — AB (ref 11.1–15.9)
MCH: 31.4 pg (ref 26.6–33.0)
MCHC: 34.4 g/dL (ref 31.5–35.7)
MCV: 91 fL (ref 79–97)
Platelets: 304 10*3/uL (ref 150–379)
RBC: 3.44 x10E6/uL — ABNORMAL LOW (ref 3.77–5.28)
RDW: 13.6 % (ref 12.3–15.4)
WBC: 10.1 10*3/uL (ref 3.4–10.8)

## 2017-09-07 LAB — HIV ANTIBODY (ROUTINE TESTING W REFLEX): HIV Screen 4th Generation wRfx: NONREACTIVE

## 2017-09-07 LAB — GLUCOSE TOLERANCE, 2 HOURS W/ 1HR
Glucose, 1 hour: 127 mg/dL (ref 65–179)
Glucose, 2 hour: 107 mg/dL (ref 65–152)
Glucose, Fasting: 66 mg/dL (ref 65–91)

## 2017-09-07 LAB — RPR: RPR Ser Ql: NONREACTIVE

## 2017-09-28 ENCOUNTER — Ambulatory Visit (INDEPENDENT_AMBULATORY_CARE_PROVIDER_SITE_OTHER): Payer: Medicaid Other | Admitting: Obstetrics and Gynecology

## 2017-09-28 VITALS — BP 110/62 | HR 103 | Wt 193.0 lb

## 2017-09-28 DIAGNOSIS — Z6791 Unspecified blood type, Rh negative: Secondary | ICD-10-CM

## 2017-09-28 DIAGNOSIS — O09899 Supervision of other high risk pregnancies, unspecified trimester: Secondary | ICD-10-CM

## 2017-09-28 DIAGNOSIS — Z98891 History of uterine scar from previous surgery: Secondary | ICD-10-CM

## 2017-09-28 DIAGNOSIS — O26899 Other specified pregnancy related conditions, unspecified trimester: Secondary | ICD-10-CM

## 2017-09-28 DIAGNOSIS — Z3480 Encounter for supervision of other normal pregnancy, unspecified trimester: Secondary | ICD-10-CM

## 2017-09-28 NOTE — Progress Notes (Signed)
Pt stated having pressure lower abdominal right side.

## 2017-09-28 NOTE — Progress Notes (Signed)
Prenatal Visit Note Date: 09/28/2017 Clinic: [facility]  Subjective:  Amy Mcpherson is a 32 y.o. [id number] at 8856w4d being seen today for ongoing prenatal care.  She is currently monitored for the following issues for this low-risk pregnancy and has Supervision of other normal pregnancy, antepartum; History of C-section x 2; Asthma; Rh negative state in antepartum period; and Depression on their problem list.  Patient reports no complaints.   Contractions: Irritability. Vag. Bleeding: None.  Movement: Present. Denies leaking of fluid.   The following portions of the patient's history were reviewed and updated as appropriate: allergies, current medications, past family history, past medical history, past social history, past surgical history and problem list. Problem list updated.  Objective:   Vitals:   09/28/17 0903  BP: 110/62  Pulse: (!) 103  Weight: 193 lb (87.5 kg)    Fetal Status: Fetal Heart Rate (bpm): 140s Fundal Height: 31 cm Movement: Present     General:  Alert, oriented and cooperative. Patient is in no acute distress.  Skin: Skin is warm and dry. No rash noted.   Cardiovascular: Normal heart rate noted  Respiratory: Normal respiratory effort, no problems with respiration noted  Abdomen: Soft, gravid, appropriate for gestational age. Pain/Pressure: Present     Pelvic:  Cervical exam deferred        Extremities: Normal range of motion.  Edema: None  Mental Status: Normal mood and affect. Normal behavior. Normal judgment and thought content.   Urinalysis:      Assessment and Plan:  Pregnancy: [id number] at 6556w4d  1. Supervision of other normal pregnancy, antepartum Inner groin musculoskeletal cramp. Routine care. No BTL  2. History of C-section x 2 D/w her can schedule at her nv so to have dates in mind  3. Rh negative state in antepartum period S/p rhogam already  Preterm labor symptoms and general obstetric precautions including but not  limited to vaginal bleeding, contractions, leaking of fluid and fetal movement were reviewed in detail with the patient. Please refer to After Visit Summary for other counseling recommendations.  Return in about 2 weeks (around 10/12/2017) for low risk ob.   [place] BingPickens, Ladonna Vanorder, MD

## 2017-10-02 ENCOUNTER — Encounter (HOSPITAL_COMMUNITY): Payer: Self-pay

## 2017-10-16 ENCOUNTER — Ambulatory Visit: Payer: Self-pay | Admitting: Clinical

## 2017-10-16 ENCOUNTER — Ambulatory Visit (INDEPENDENT_AMBULATORY_CARE_PROVIDER_SITE_OTHER): Payer: Medicaid Other | Admitting: Advanced Practice Midwife

## 2017-10-16 VITALS — BP 111/49 | HR 97 | Wt 191.1 lb

## 2017-10-16 DIAGNOSIS — F329 Major depressive disorder, single episode, unspecified: Secondary | ICD-10-CM

## 2017-10-16 DIAGNOSIS — F4323 Adjustment disorder with mixed anxiety and depressed mood: Secondary | ICD-10-CM

## 2017-10-16 DIAGNOSIS — F32A Depression, unspecified: Secondary | ICD-10-CM

## 2017-10-16 DIAGNOSIS — Z3483 Encounter for supervision of other normal pregnancy, third trimester: Secondary | ICD-10-CM

## 2017-10-16 DIAGNOSIS — Z3480 Encounter for supervision of other normal pregnancy, unspecified trimester: Secondary | ICD-10-CM

## 2017-10-16 NOTE — Progress Notes (Signed)
   PRENATAL VISIT NOTE  Subjective:  Amy Mcpherson is a 32 y.o. [id number] at 694w1d being seen today for ongoing prenatal care.  She is currently monitored for the following issues for this low-risk pregnancy and has Supervision of other normal pregnancy, antepartum; History of C-section x 2; Asthma; Rh negative state in antepartum period; and Depression on their problem list.  Patient reports no complaints.  Contractions: Not present. Vag. Bleeding: None.  Movement: Present. Denies leaking of fluid.   The following portions of the patient's history were reviewed and updated as appropriate: allergies, current medications, past family history, past medical history, past social history, past surgical history and problem list. Problem list updated.  Objective:   Vitals:   10/16/17 0933  BP: (!) 111/49  Pulse: 97  Weight: 191 lb 1.6 oz (86.7 kg)    Fetal Status: Fetal Heart Rate (bpm): 155 Fundal Height: 34 cm Movement: Present     General:  Alert, oriented and cooperative. Patient is in no acute distress.  Skin: Skin is warm and dry. No rash noted.   Cardiovascular: Normal heart rate noted  Respiratory: Normal respiratory effort, no problems with respiration noted  Abdomen: Soft, gravid, appropriate for gestational age.  Pain/Pressure: Present     Pelvic: Cervical exam deferred        Extremities: Normal range of motion.  Edema: None  Mental Status:  Normal mood and affect. Normal behavior. Normal judgment and thought content.   Assessment and Plan:  Pregnancy: [id number] at 694w1d  1. Depression determined by examination - Ambulatory referral to Integrated Behavioral Health  2. Supervision of other normal pregnancy, antepartum - Routine care  - Would like to schedule repeat c-section for 11/27/17 as her mother is coming to town then to help.   Preterm labor symptoms and general obstetric precautions including but not limited to vaginal bleeding, contractions, leaking of fluid and  fetal movement were reviewed in detail with the patient. Please refer to After Visit Summary for other counseling recommendations.  Return in about 2 weeks (around 10/30/2017).   Thressa ShellerHeather Normajean Nash, CNM

## 2017-10-16 NOTE — Patient Instructions (Signed)
AREA PEDIATRIC/FAMILY [place] [address], [address] [place], [place]  [address] Phone - [phone]   Fax - [phone]  [facility] [place] [place] [address] [place], [place] [address] Phone - [phone]   Fax - [place] [address] [place], [place]  [address] Phone - [phone]   Fax - [phone]  [place]. [address] [place] [address] [place], [place]  [address] Phone - [phone]   Fax - [phone]  [place] [address] [place], [place]  [address] Phone - [phone]   Fax - [phone]  [facility] [address] [place], [place]  [address] Phone - [phone]   Fax - [place] [address] [place], [place]  [address] Phone - [phone]   Fax - [phone]  [place] [address] [place], [place] [address] [place], [place]  [address] Phone - [phone]   Fax - [place] [address] [place], [place]  [address] Phone - [phone]   Fax - [phone] [facility] [place]. [address] [place], [place]  [address] Phone - [phone]   Fax - [phone]  [place] [address] [place], [place]  [address] Phone - [phone]   Fax - [phone]  [facility] [place], [place], [place]  [address] Phone - [phone]   Fax - [place] [address] [place], [place]  [address] Phone - [phone]   Fax - [phone]  [facility] [address] [place], [place]  [address] Phone - [place], [place]  [address] Phone - [phone]   Fax - [place] [address] [place], [place]  [address] Phone - [phone]   Fax - [phone]  [place] [address] [place], [place]  [address] Phone - [phone]   Fax - [place]. [place], [place]  [address] Phone - [phone]   Fax - [place], [place], [place]  [address] Phone - [phone]   Fax - [place] [address], [place], [place]  [address] Phone - [phone]   Fax - [phone]  [place] [address] [place], [place]  [address] Phone - [phone]   Fax - [place] [address] [place], [place]  [address] Phone - [phone]   Fax - [phone]  [place] [address] [place], [place]  [address] Phone - [phone]   Fax - [phone] [place] [address] [place], [place]  [address] Phone - [phone]   Fax - [place] [address] [place], [place]  [address] Phone - [phone]   Fax - [place] [address] [place], [place]  [address] Phone - [phone]   Fax - [phone]  [place] [address] [place] [address] Phone [phone]  Fax [phone]  Places to have your son circumcised:    [facility] [phone] while you  are in hospital  [facility] [phone] $244 by 4 wks  [facility] [phone] $175 by 2 wks  [place] [phone] $250 by 7 days [facility] [phone] $150 by 4 wks  These prices sometimes change but are roughly what you can expect to pay. Please  call and confirm pricing.   Circumcision is considered an elective/non-medically necessary procedure. There are many reasons parents decide to have their sons circumsized. During the first year of life circumcised males have a reduced risk of urinary tract infections but after this year the rates between circumcised males and uncircumcised males are the same.  It is safe to have your son circumcised outside of the hospital and the places above perform them regularly.   Deciding about Circumcision in Baby Boys  (Up-to-date The Basics)  What is circumcision?  Circumcision is a surgery that removes the skin that covers the tip of the penis, called the "foreskin" Circumcision is usually done when a boy is between 45 and 43 days old. In the [place], circumcision is common. In some other countries, fewer boys are circumcised. Circumcision is a common tradition in some religions.  Should I have my baby boy circumcised?  There is no easy answer. Circumcision has some benefits. But it also has risks. After talking with your doctor, you will have to decide for yourself what is right for your family.  What are the benefits of circumcision?  Circumcised boys seem to have slightly lower rates of: ?Urinary tract infections ?Swelling of the opening at the tip of the penis Circumcised men seem to have slightly lower rates of: ?Urinary tract infections ?Swelling of the opening at the tip of the penis ?Penis cancer ?HIV and other infections that you catch during sex ?Cervical cancer in the women they have sex with Even so, in the [place], the risks of these problems are small - even in boys and men who have not been circumcised. Plus, boys and men who are not circumcised can reduce these extra risks by: ?Cleaning their penis well ?Using condoms during sex  What are the risks of circumcision?  Risks include: ?Bleeding or infection from the surgery ?Damage to or amputation of the  penis ?A chance that the doctor will cut off too much or not enough of the foreskin ?A chance that sex won't feel as good later in life Only about 1 out of every 200 circumcisions leads to problems. There is also a chance that your health insurance won't pay for circumcision.  How is circumcision done in baby boys?  First, the baby gets medicine for pain relief. This might be a cream on the skin or a shot into the base of the penis. Next, the doctor cleans the baby's penis well. Then he or she uses special tools to cut off the foreskin. Finally, the doctor wraps a bandage (called gauze) around the baby's penis. If you have your baby circumcised, his doctor or nurse will give you instructions on how to care for him after the surgery. It is important that you follow those instructions carefully. Childbirth Education Options: [facility] Department Classes:  Childbirth education classes can help you get ready for a positive parenting experience. You can also meet other expectant parents and get free stuff for your baby. Each class runs for five weeks on the same night and costs $45 for the mother-to-be and her support person. Medicaid covers the cost if you are eligible. Call [phone] to register. [facility] Childbirth Education:  [phone]  or [phone] or [email or web address]  Baby & Me Class: Discuss newborn & infant parenting and family adjustment issues with other new mothers in a relaxed environment. Each week brings a new speaker or baby-centered activity. We encourage new mothers to join [place] every Thursday at 11:00am. Babies birth until crawling. No registration or fee. Daddy [employer]: This course offers Dads-to-be the tools and knowledge needed to feel confident on their journey to becoming new fathers. Experienced dads, who have been trained as coaches, teach dads-to-be how to hold, comfort, diaper, swaddle and play with their infant while being able to support  the new mom as well. A class for men taught by men. $25/dad Big Brother/Big Sister: Let your children share in the joy of a new brother or sister in this special class designed just for them. Class includes discussion about how families care for babies: swaddling, holding, diapering, safety as well as how they can be helpful in their new role. This class is designed for children ages 28 to 39, but any age is welcome. Please register each child individually. $5/child  Mom Talk: This mom-led group offers support and connection to mothers as they journey through the adjustments and struggles of that sometimes overwhelming first year after the birth of a child. Tuesdays at 10:00am and Thursdays at 6:00pm. Babies welcome. No registration or fee. Breastfeeding Support Group: This group is a mother-to-mother support circle where moms have the opportunity to share their breastfeeding experiences. A Lactation Consultant is present for questions and concerns. Meets each Tuesday at 11:00am. No fee or registration. Breastfeeding Your Baby: Learn what to expect in the first days of breastfeeding your newborn.  This class will help you feel more confident with the skills needed to begin your breastfeeding experience. Many new mothers are concerned about breastfeeding after leaving the hospital. This class will also address the most common fears and challenges about breastfeeding during the first few weeks, months and beyond. (call for fee) Comfort Techniques and Tour: This 2 hour interactive class will provide you the opportunity to learn & practice hands-on techniques that can help relieve some of the discomfort of labor and encourage your baby to rotate toward the best position for birth. You and your partner will be able to try a variety of labor positions with birth balls and rebozos as well as practice breathing, relaxation, and visualization techniques. A tour of the [facility] is included  with this class. $20 per registrant and support person Childbirth Class- Weekend Option: This class is a Weekend version of our Birth & Baby series. It is designed for parents who have a difficult time fitting several weeks of classes into their schedule. It covers the care of your newborn and the basics of labor and childbirth. It also includes a [place] [facility] and lunch. The class is held two consecutive days: beginning on Friday evening from 6:30 - 8:30 p.m. and the next day, Saturday from 9 a.m. - 4 p.m. (call for fee) Doren Custard Class: Interested in a waterbirth?  This informational class will help you discover whether waterbirth is the right fit for you. Education about waterbirth itself, supplies you would need and how to assemble your support team is what you can expect from this class. Some obstetrical practices require this class in order to pursue a waterbirth. (Not all obstetrical practices offer waterbirth-check with your healthcare provider.) Register only the expectant mom, but you are encouraged to bring your partner to  class! Required if planning waterbirth, no fee. Infant/Child CPR: Parents, grandparents, babysitters, and friends learn Cardio-Pulmonary Resuscitation skills for infants and children. You will also learn how to treat both conscious and unconscious choking in infants and children. This Family & Friends program does not offer certification. Register each participant individually to ensure that enough mannequins are available. (Call for fee) Grandparent Love: Expecting a grandbaby? This class is for you! Learn about the latest infant care and safety recommendations and ways to support your own child as he or she transitions into the parenting role. Taught by Registered Nurses who are childbirth instructors, but most importantly...they are grandmothers too! $10/person. Childbirth Class- Natural Childbirth: This series of 5 weekly classes is for  expectant parents who want to learn and practice natural methods of coping with the process of labor and childbirth. Relaxation, breathing, massage, visualization, role of the partner, and helpful positioning are highlighted. Participants learn how to be confident in their body's ability to give birth. This class will empower and help parents make informed decisions about their own care. Includes discussion that will help new parents transition into the immediate postpartum period. [facility] is included. We suggest taking this class between 25-32 weeks, but it's only a recommendation. $75 per registrant and one support person or $30 Medicaid. Childbirth Class- 3 week Series: This option of 3 weekly classes helps you and your labor partner prepare for childbirth. Newborn care, labor & birth, cesarean birth, pain management, and comfort techniques are discussed and a [place] [facility] is included. The class meets at the same time, on the same day of the week for 3 consecutive weeks beginning with the starting date you choose. $60 for registrant and one support person.  Marvelous Multiples: Expecting twins, triplets, or more? This class covers the differences in labor, birth, parenting, and breastfeeding issues that face multiples' parents. NICU tour is included. Led by a Certified Childbirth Educator who is the mother of twins. No fee. Caring for Baby: This class is for expectant and adoptive parents who want to learn and practice the most up-to-date newborn care for their babies. Focus is on birth through the first six weeks of life. Topics include feeding, bathing, diapering, crying, umbilical cord care, circumcision care and safe sleep. Parents learn to recognize symptoms of illness and when to call the pediatrician. Register only the mom-to-be and your partner or support person can plan to come with you! $10 per registrant and support  person Childbirth Class- online option: This online class offers you the freedom to complete a Birth and Baby series in the comfort of your own home. The flexibility of this option allows you to review sections at your own pace, at times convenient to you and your support people. It includes additional video information, animations, quizzes, and extended activities. Get organized with helpful eClass tools, checklists, and trackers. Once you register online for the class, you will receive an email within a few days to accept the invitation and begin the class when the time is right for you. The content will be available to you for 60 days. $60 for 60 days of online access for you and your support people.  Local Doulas: Natural Baby Doulas [email or web address] Tel: [phone] [email or web address] [employer] [phone] [email or web address] The Labor Hassell Halim  (also do waterbirth tub rental) [phone] [email or web address] Triad Birth Doula [phone] [email or web address] [email or web address].fi Sacred Rhythms  [phone] https://[email or web address]-[email or web address]/ [employer] (PADA) [email or web address] [place] Birth and Baby  [email or web address]

## 2017-10-16 NOTE — BH Specialist Note (Signed)
Integrated Behavioral Health Follow Up Visit  MRN: [id number] Name: Amy Congonjelissa Louischarles  Number of Integrated Behavioral Health Clinician visits: 3/6 Session Start time: 9:55am  Session End time: 10:10am Total time: 15 minutes  Type of Service: Integrated Behavioral Health- Individual/Family Interpretor:No. Interpretor Name and Language: N/A  SUBJECTIVE: Amy Mcpherson is a 32 y.o. female accompanied by 31 yo/Daughter Patient was referred by Thressa ShellerHeather Hogan [employer] for Depression and Anxiety.. Patient reports the following symptoms/concerns: Pt. States her primary concern is Continued stress adjusting to moving in new home, sleep difficulty, physically uncomfortable; pt. States her mother will be helping postpartum which helps ease some of her anxiety.   Duration of problem: Current Pregnancy; Severity of problem: moderate  OBJECTIVE: Mood: Normal and Affect: Appropriate Risk of harm to self or others: No plan to harm self or others  LIFE CONTEXT: Family and Social: Lives with FOB and children (54 yo daughter and 749 yo son); Mother plans to visit postpartum  School/Work: Actively looking for work. FOB is working. Self-Care: No substances some sleeping and eating difficulties  Life Changes: Current pregnancy, children moved from [place], family moved into a new home.  GOALS ADDRESSED: Patient will: 1.  Reduce symptoms of: anxiety and depression  2.  Increase knowledge and/or ability of: healthy habits  3.  Demonstrate ability to: Increase healthy adjustment to current life circumstances  INTERVENTIONS: Interventions utilized:  Solution-Focused Strategies Standardized Assessments completed: GAD-7 and PHQ 9  ASSESSMENT: Patient currently experiencing Adjustment disorder with mixed anxious and depressed mood.   Patient may benefit from continued brief therapeutic intervention.  PLAN: 1. Follow up with behavioral health clinician on : 1 month 2. Behavioral recommendations:  -Take  prenatal vitamins daily as recommended by medical provider -Consider additional self coping strategies as discussed in office visit.   3. Referral(s): Integrated [facility] (In Clinic) 4. "From scale of 1-10, how likely are you to follow plan?": 8  Aishi Courts C Tippi Mccrae, LCSW

## 2017-10-27 NOTE — BH Specialist Note (Signed)
Integrated Behavioral Health Follow Up Visit  MRN: [id number] Name: Amy Mcpherson  Number of Integrated Behavioral Health Clinician visits: 4/6 Session Start time: 9:15 AM   Session End time:9:34 AM   Total time: 20 minutes  Type of Service: Integrated Behavioral Health- Individual/Family Interpretor:No. Interpretor Name and Language: n/a  SUBJECTIVE: Amy Congonjelissa Huster is a 32 y.o. female accompanied by n/a Patient was referred by Thressa ShellerHeather Hogan, CNM for depression and anxiety. Patient reports the following symptoms/concerns: Pt states her primary concern today is feeling irritable when she is unable to get in a comfortable position to sleep, and forgetting to take her prenatal vitamins.  Duration of problem: Current pregnancy; Severity of problem: moderate  OBJECTIVE: Mood: Appropriate and Affect: Appropriate Risk of harm to self or others: No plan to harm self or others   LIFE CONTEXT: Family and Social: Lives with FOB and two children(4yo daughter, 9yo son). Pt's mother will visit/help postpartum School/Work: FOB working; pt will seek work postpartum Self-Care: Being mindful of importance of caring for self Life Changes: Current pregnancy; children moved from [place], then entire family moved into a new home   GOALS ADDRESSED: Patient will: 1.  Reduce symptoms of: anxiety and depression  2.  Increase knowledge and/or ability of: healthy habits  3.  Demonstrate ability to: Increase healthy adjustment to current life circumstances and Improve medication compliance  INTERVENTIONS: Interventions utilized:  Solution-Focused Strategies Standardized Assessments completed: GAD-7 and PHQ 9  ASSESSMENT: Patient currently experiencing Adjustment disorder with mixed anxious and depressed mood.   Patient may benefit from continued brief therapeutic interventions regarding coping with symptoms of anxiety and depression.  PLAN: 1. Follow up with behavioral health clinician on :  One month 2. Behavioral recommendations:  -Put prenatal vitamins beside Tums bottle -Consider obtaining pill box with days of the week clearly labelled, to help remember to take vitamins daily -Continue taking prenatal vitamins daily, as recommended by medical provider  -Continue sleeping on couch, if it continues to improve sleep -Self-coping strategies daily, as needed 3. Referral(s): Integrated Behavioral Health Services (In Clinic) 4. "From scale of 1-10, how likely are you to follow plan?": 9  Rae LipsJamie C McMannes, LCSW   [facility] 10/30/2017 10/16/2017 08/10/2017 07/13/2017  Decreased Interest 1 2 2 2   Down, Depressed, Hopeless 1 2 2 3   PHQ - 2 Score 2 4 4 5   Altered sleeping 1 2 3 1   Tired, decreased energy 1 1 2 3   Change in appetite 2 3 3 2   Feeling bad or failure about yourself  2 2 2 3   Trouble concentrating 0 1 0 0  Moving slowly or fidgety/restless 0 1 0 0  Suicidal thoughts 0 1 0 0  PHQ-9 Score 8 15 14 14    GAD 7 : Generalized Anxiety Score 10/30/2017 10/16/2017 08/10/2017 07/13/2017  Nervous, Anxious, on Edge 2 2 2 2   Control/stop worrying 2 3 3 3   Worry too much - different things 2 3 3 3   Trouble relaxing 1 3 3 3   Restless 0 0 0 0  Easily annoyed or irritable 3 3 2 3   Afraid - awful might happen 2 2 3 3   Total GAD 7 Score 12 16 16  17

## 2017-10-30 ENCOUNTER — Encounter: Payer: Self-pay | Admitting: Advanced Practice Midwife

## 2017-10-30 ENCOUNTER — Ambulatory Visit (INDEPENDENT_AMBULATORY_CARE_PROVIDER_SITE_OTHER): Payer: Medicaid Other | Admitting: Advanced Practice Midwife

## 2017-10-30 ENCOUNTER — Ambulatory Visit (INDEPENDENT_AMBULATORY_CARE_PROVIDER_SITE_OTHER): Payer: Medicaid Other | Admitting: Clinical

## 2017-10-30 VITALS — BP 110/47 | HR 92 | Wt 203.6 lb

## 2017-10-30 DIAGNOSIS — F4323 Adjustment disorder with mixed anxiety and depressed mood: Secondary | ICD-10-CM

## 2017-10-30 DIAGNOSIS — Z98891 History of uterine scar from previous surgery: Secondary | ICD-10-CM

## 2017-10-30 DIAGNOSIS — Z3480 Encounter for supervision of other normal pregnancy, unspecified trimester: Secondary | ICD-10-CM

## 2017-10-30 DIAGNOSIS — Z3483 Encounter for supervision of other normal pregnancy, third trimester: Secondary | ICD-10-CM

## 2017-10-30 NOTE — Progress Notes (Signed)
   PRENATAL VISIT NOTE  Subjective:  Amy Mcpherson is a 32 y.o. [id number] at 5262w1d being seen today for ongoing prenatal care.  She is currently monitored for the following issues for this low-risk pregnancy and has Supervision of other normal pregnancy, antepartum; History of C-section x 2; Asthma; Rh negative state in antepartum period; and Depression on their problem list.  Patient reports no complaints.  Contractions: Not present. Vag. Bleeding: None.  Movement: Present. Denies leaking of fluid.   The following portions of the patient's history were reviewed and updated as appropriate: allergies, current medications, past family history, past medical history, past social history, past surgical history and problem list. Problem list updated.  Objective:   Vitals:   10/30/17 0833  BP: (!) 110/47  Pulse: 92  Weight: 203 lb 9.6 oz (92.4 kg)    Fetal Status: Fetal Heart Rate (bpm): 142 Fundal Height: 37 cm Movement: Present     General:  Alert, oriented and cooperative. Patient is in no acute distress.  Skin: Skin is warm and dry. No rash noted.   Cardiovascular: Normal heart rate noted  Respiratory: Normal respiratory effort, no problems with respiration noted  Abdomen: Soft, gravid, appropriate for gestational age.  Pain/Pressure: Present     Pelvic: Cervical exam deferred        Extremities: Normal range of motion.  Edema: Trace  Mental Status:  Normal mood and affect. Normal behavior. Normal judgment and thought content.   Assessment and Plan:  Pregnancy: [id number] at 5862w1d  1. History of C-section x 2 - Plans repeat, Jacinda messaged  2. Supervision of other normal pregnancy, antepartum - Routine care -GBS at next visit   Preterm labor symptoms and general obstetric precautions including but not limited to vaginal bleeding, contractions, leaking of fluid and fetal movement were reviewed in detail with the patient. Please refer to After Visit Summary for other counseling  recommendations.  Return in about 2 weeks (around 11/13/2017).   Thressa ShellerHeather Micah Galeno, CNM

## 2017-10-30 NOTE — Progress Notes (Signed)
Pt states this morning after using bathroom, when she laid back down a gush of fluid started to come out,no bloody show or anything.

## 2017-10-30 NOTE — Patient Instructions (Addendum)
Our surgical scheduler has been given your information and request for c-section on 11/27/17. She will call you with further details.    Cesarean Delivery Cesarean birth, or cesarean delivery, is the surgical delivery of a baby through an incision in the abdomen and the uterus. This may be referred to as a C-section. This procedure may be scheduled ahead of time, or it may be done in an emergency situation. Tell a health care provider about:  Any allergies you have.  All medicines you are taking, including vitamins, herbs, eye drops, creams, and over-the-counter medicines.  Any problems you or family members have had with anesthetic medicines.  Any blood disorders you have.  Any surgeries you have had.  Any medical conditions you have.  Whether you or any members of your family have a history of deep vein thrombosis (DVT) or pulmonary embolism (PE). What are the risks? Generally, this is a safe procedure. However, problems may occur, including:  Infection.  Bleeding.  Allergic reactions to medicines.  Damage to other structures or organs.  Blood clots.  Injury to your baby.  What happens before the procedure?  Follow instructions from your health care provider about eating or drinking restrictions.  Follow instructions from your health care provider about bathing before your procedure to help reduce your risk of infection.  If you know that you are going to have a cesarean delivery, do not shave your pubic area. Shaving before the procedure may increase your risk of infection.  Ask your health care provider about: ? Changing or stopping your regular medicines. This is especially important if you are taking diabetes medicines or blood thinners. ? Your pain management plan. This is especially important if you plan to breastfeed your baby. ? How long you will be in the hospital after the procedure. ? Any concerns you may have about receiving blood products if you need them  during the procedure. ? Cord blood banking, if you plan to collect your baby's umbilical cord blood.  You may also want to ask your health care provider: ? Whether you will be able to hold or breastfeed your baby while you are still in the operating room. ? Whether your baby can stay with you immediately after the procedure and during your recovery. ? Whether a family member or a person of your choice can go with you into the operating room and stay with you during the procedure, immediately after the procedure, and during your recovery.  Plan to have someone drive you home when you are discharged from the hospital. What happens during the procedure?  Fetal monitors will be placed on your abdomen to monitor your heart rate and your baby's heart rate.  Depending on the reason for your cesarean delivery, you may have a physical exam or additional testing, such as an ultrasound.  An IV tube will be inserted into one of your veins.  You may have your blood or urine tested.  You will be given antibiotic medicine to help prevent infection.  You may be given a special warming gown to wear to keep your temperature stable.  Hair may be removed from your pubic area.  The skin of your pubic area and lower abdomen will be cleaned with a germ-killing solution (antiseptic).  A catheter may be inserted into your bladder through your urethra. This drains your urine during the procedure.  You may be given one or more of the following: ? A medicine to numb the area (local anesthetic). ?  A medicine to make you fall asleep (general anesthetic). ? A medicine (regional anesthetic) that is injected into your back or through a small thin tube placed in your back (spinal anesthetic or epidural anesthetic). This numbs everything below the injection site and allows you to stay awake during your procedure. If this makes you feel nauseous, tell your health care provider. Medicines will be available to help reduce  any nausea you may feel.  An incision will be made in your abdomen, and then in your uterus.  If you are awake during your procedure, you may feel tugging and pulling in your abdomen, but you should not feel pain. If you feel pain, tell your health care provider immediately.  Your baby will be removed from your uterus. You may feel more pressure or pushing while this happens.  Immediately after birth, your baby will be dried and kept warm. You may be able to hold and breastfeed your baby. The umbilical cord may be clamped and cut during this time.  Your placenta will be removed from your uterus.  Your incisions will be closed with stitches (sutures). Staples, skin glue, or adhesive strips may also be applied to the incision in your abdomen.  Bandages (dressings) will be placed over the incision in your abdomen. The procedure may vary among health care providers and hospitals. What happens after the procedure?  Your blood pressure, heart rate, breathing rate, and blood oxygen level will be monitored often until the medicines you were given have worn off.  You may continue to receive fluids and medicines through an IV tube.  You will have some pain. Medicines will be available to help control your pain.  To help prevent blood clots: ? You may be given medicines. ? You may have to wear compression stockings or devices. ? You will be encouraged to walk around when you are able.  Hospital staff will encourage and support bonding with your baby. Your hospital may allow you and your baby to stay in the same room (rooming in) during your hospital stay to encourage successful breastfeeding.  You may be encouraged to cough and breathe deeply often. This helps to prevent lung problems.  If you have a catheter draining your urine, it will be removed as soon as possible after your procedure. This information is not intended to replace advice given to you by your health care provider. Make sure  you discuss any questions you have with your health care provider. Document Released: 09/12/2005 Document Revised: 02/18/2016 Document Reviewed: 06/23/2015 Elsevier Interactive Patient Education  2018 [employer].  [employer] of the uterus can occur throughout pregnancy, but they are not always a sign that you are in labor. You may have practice contractions called Braxton Hicks contractions. These false labor contractions are sometimes confused with true labor. What are Deberah Pelton contractions? Braxton Hicks contractions are tightening movements that occur in the muscles of the uterus before labor. Unlike true labor contractions, these contractions do not result in opening (dilation) and thinning of the cervix. Toward the end of pregnancy (32-34 weeks), Braxton Hicks contractions can happen more often and may become stronger. These contractions are sometimes difficult to tell apart from true labor because they can be very uncomfortable. You should not feel embarrassed if you go to the hospital with false labor. Sometimes, the only way to tell if you are in true labor is for your health care provider to look for changes in the cervix. The health care provider  will do a physical exam and may monitor your contractions. If you are not in true labor, the exam should show that your cervix is not dilating and your water has not broken. If there are other health problems associated with your pregnancy, it is completely safe for you to be sent home with false labor. You may continue to have Braxton Hicks contractions until you go into true labor. How to tell the difference between true labor and false labor True labor  Contractions last 30-70 seconds.  Contractions become very regular.  Discomfort is usually felt in the top of the uterus, and it spreads to the lower abdomen and low back.  Contractions do not go away with walking.  Contractions usually become more  intense and increase in frequency.  The cervix dilates and gets thinner. False labor  Contractions are usually shorter and not as strong as true labor contractions.  Contractions are usually irregular.  Contractions are often felt in the front of the lower abdomen and in the groin.  Contractions may go away when you walk around or change positions while lying down.  Contractions get weaker and are shorter-lasting as time goes on.  The cervix usually does not dilate or become thin. Follow these instructions at home:  Take over-the-counter and prescription medicines only as told by your health care provider.  Keep up with your usual exercises and follow other instructions from your health care provider.  Eat and drink lightly if you think you are going into labor.  If Braxton Hicks contractions are making you uncomfortable: ? Change your position from lying down or resting to walking, or change from walking to resting. ? Sit and rest in a tub of warm water. ? Drink enough fluid to keep your urine pale yellow. Dehydration may cause these contractions. ? Do slow and deep breathing several times an hour.  Keep all follow-up prenatal visits as told by your health care provider. This is important. Contact a health care provider if:  You have a fever.  You have continuous pain in your abdomen. Get help right away if:  Your contractions become stronger, more regular, and closer together.  You have fluid leaking or gushing from your vagina.  You pass blood-tinged mucus (bloody show).  You have bleeding from your vagina.  You have low back pain that you never had before.  You feel your baby's head pushing down and causing pelvic pressure.  Your baby is not moving inside you as much as it used to. Summary  Contractions that occur before labor are called Braxton Hicks contractions, false labor, or practice contractions.  Braxton Hicks contractions are usually shorter,  weaker, farther apart, and less regular than true labor contractions. True labor contractions usually become progressively stronger and regular and they become more frequent.  Manage discomfort from [facility] contractions by changing position, resting in a warm bath, drinking plenty of water, or practicing deep breathing. This information is not intended to replace advice given to you by your health care provider. Make sure you discuss any questions you have with your health care provider. Document Released: 01/26/2017 Document Revised: 01/26/2017 Document Reviewed: 01/26/2017 Elsevier Interactive Patient Education  2018 [employer].

## 2017-11-13 ENCOUNTER — Telehealth (HOSPITAL_COMMUNITY): Payer: Self-pay | Admitting: *Deleted

## 2017-11-13 ENCOUNTER — Encounter (HOSPITAL_COMMUNITY): Payer: Self-pay

## 2017-11-13 NOTE — Telephone Encounter (Signed)
Preadmission screen  

## 2017-11-15 ENCOUNTER — Encounter: Payer: Self-pay | Admitting: Advanced Practice Midwife

## 2017-11-15 ENCOUNTER — Other Ambulatory Visit (HOSPITAL_COMMUNITY)
Admission: RE | Admit: 2017-11-15 | Discharge: 2017-11-15 | Disposition: A | Discharge status: Home / Self Care | Payer: Medicaid Other | Source: Ambulatory Visit | Attending: Advanced Practice Midwife | Admitting: Advanced Practice Midwife

## 2017-11-15 ENCOUNTER — Ambulatory Visit (INDEPENDENT_AMBULATORY_CARE_PROVIDER_SITE_OTHER): Payer: Medicaid Other | Admitting: Advanced Practice Midwife

## 2017-11-15 VITALS — BP 103/53 | Wt 206.0 lb

## 2017-11-15 DIAGNOSIS — Z3483 Encounter for supervision of other normal pregnancy, third trimester: Secondary | ICD-10-CM | POA: Insufficient documentation

## 2017-11-15 DIAGNOSIS — Z98891 History of uterine scar from previous surgery: Secondary | ICD-10-CM

## 2017-11-15 DIAGNOSIS — Z3480 Encounter for supervision of other normal pregnancy, unspecified trimester: Secondary | ICD-10-CM

## 2017-11-15 NOTE — Addendum Note (Signed)
Addended by: Garret ReddishBARNES, Rachella Basden M on: 11/15/2017 11:28 AM   Modules accepted: Orders

## 2017-11-15 NOTE — Patient Instructions (Signed)
Cesarean Delivery Cesarean birth, or cesarean delivery, is the surgical delivery of a baby through an incision in the abdomen and the uterus. This may be referred to as a C-section. This procedure may be scheduled ahead of time, or it may be done in an emergency situation. Tell a health care provider about:  Any allergies you have.  All medicines you are taking, including vitamins, herbs, eye drops, creams, and over-the-counter medicines.  Any problems you or family members have had with anesthetic medicines.  Any blood disorders you have.  Any surgeries you have had.  Any medical conditions you have.  Whether you or any members of your family have a history of deep vein thrombosis (DVT) or pulmonary embolism (PE). What are the risks? Generally, this is a safe procedure. However, problems may occur, including:  Infection.  Bleeding.  Allergic reactions to medicines.  Damage to other structures or organs.  Blood clots.  Injury to your baby.  What happens before the procedure?  Follow instructions from your health care provider about eating or drinking restrictions.  Follow instructions from your health care provider about bathing before your procedure to help reduce your risk of infection.  If you know that you are going to have a cesarean delivery, do not shave your pubic area. Shaving before the procedure may increase your risk of infection.  Ask your health care provider about: ? Changing or stopping your regular medicines. This is especially important if you are taking diabetes medicines or blood thinners. ? Your pain management plan. This is especially important if you plan to breastfeed your baby. ? How long you will be in the hospital after the procedure. ? Any concerns you may have about receiving blood products if you need them during the procedure. ? Cord blood banking, if you plan to collect your baby's umbilical cord blood.  You may also want to ask your  health care provider: ? Whether you will be able to hold or breastfeed your baby while you are still in the operating room. ? Whether your baby can stay with you immediately after the procedure and during your recovery. ? Whether a family member or a person of your choice can go with you into the operating room and stay with you during the procedure, immediately after the procedure, and during your recovery.  Plan to have someone drive you home when you are discharged from the hospital. What happens during the procedure?  Fetal monitors will be placed on your abdomen to monitor your heart rate and your baby's heart rate.  Depending on the reason for your cesarean delivery, you may have a physical exam or additional testing, such as an ultrasound.  An IV tube will be inserted into one of your veins.  You may have your blood or urine tested.  You will be given antibiotic medicine to help prevent infection.  You may be given a special warming gown to wear to keep your temperature stable.  Hair may be removed from your pubic area.  The skin of your pubic area and lower abdomen will be cleaned with a germ-killing solution (antiseptic).  A catheter may be inserted into your bladder through your urethra. This drains your urine during the procedure.  You may be given one or more of the following: ? A medicine to numb the area (local anesthetic). ? A medicine to make you fall asleep (general anesthetic). ? A medicine (regional anesthetic) that is injected into your back or through a small   thin tube placed in your back (spinal anesthetic or epidural anesthetic). This numbs everything below the injection site and allows you to stay awake during your procedure. If this makes you feel nauseous, tell your health care provider. Medicines will be available to help reduce any nausea you may feel.  An incision will be made in your abdomen, and then in your uterus.  If you are awake during your  procedure, you may feel tugging and pulling in your abdomen, but you should not feel pain. If you feel pain, tell your health care provider immediately.  Your baby will be removed from your uterus. You may feel more pressure or pushing while this happens.  Immediately after birth, your baby will be dried and kept warm. You may be able to hold and breastfeed your baby. The umbilical cord may be clamped and cut during this time.  Your placenta will be removed from your uterus.  Your incisions will be closed with stitches (sutures). Staples, skin glue, or adhesive strips may also be applied to the incision in your abdomen.  Bandages (dressings) will be placed over the incision in your abdomen. The procedure may vary among health care providers and hospitals. What happens after the procedure?  Your blood pressure, heart rate, breathing rate, and blood oxygen level will be monitored often until the medicines you were given have worn off.  You may continue to receive fluids and medicines through an IV tube.  You will have some pain. Medicines will be available to help control your pain.  To help prevent blood clots: ? You may be given medicines. ? You may have to wear compression stockings or devices. ? You will be encouraged to walk around when you are able.  Hospital staff will encourage and support bonding with your baby. Your hospital may allow you and your baby to stay in the same room (rooming in) during your hospital stay to encourage successful breastfeeding.  You may be encouraged to cough and breathe deeply often. This helps to prevent lung problems.  If you have a catheter draining your urine, it will be removed as soon as possible after your procedure. This information is not intended to replace advice given to you by your health care provider. Make sure you discuss any questions you have with your health care provider. Document Released: 09/12/2005 Document Revised: 02/18/2016  Document Reviewed: 06/23/2015 Elsevier Interactive Patient Education  2018 Elsevier Inc[employer]  

## 2017-11-15 NOTE — Progress Notes (Signed)
   PRENATAL VISIT NOTE  Subjective:  Amy Mcpherson is a 32 y.o. [id number] at 10960w3d being seen today for ongoing prenatal care.  She is currently monitored for the following issues for this low-risk pregnancy and has Supervision of other normal pregnancy, antepartum; History of C-section x 2; Asthma; Rh negative state in antepartum period; and Depression on their problem list.  Patient reports no complaints.  Contractions: Irritability. Vag. Bleeding: None.  Movement: Present. Denies leaking of fluid.   The following portions of the patient's history were reviewed and updated as appropriate: allergies, current medications, past family history, past medical history, past social history, past surgical history and problem list. Problem list updated.  Objective:   Vitals:   11/15/17 1027  BP: (!) 103/53  Weight: 206 lb (93.4 kg)    Fetal Status: Fetal Heart Rate (bpm): 152 Fundal Height: 38 cm Movement: Present     General:  Alert, oriented and cooperative. Patient is in no acute distress.  Skin: Skin is warm and dry. No rash noted.   Cardiovascular: Normal heart rate noted  Respiratory: Normal respiratory effort, no problems with respiration noted  Abdomen: Soft, gravid, appropriate for gestational age.  Pain/Pressure: Present     Pelvic: Cervical exam performed Dilation: Closed Effacement (%): Thick Station: -2  Extremities: Normal range of motion.  Edema: Trace  Mental Status:  Normal mood and affect. Normal behavior. Normal judgment and thought content.   Assessment and Plan:  Pregnancy: [id number] at 5260w3d  1. Supervision of other normal pregnancy, antepartum - GBS today   2. History of C-section x 2 - Scheduled for 11/27/17  Term labor symptoms and general obstetric precautions including but not limited to vaginal bleeding, contractions, leaking of fluid and fetal movement were reviewed in detail with the patient. Please refer to After Visit Summary for other counseling  recommendations.  Return in about 1 week (around 11/22/2017).   Thressa ShellerHeather Aundrea Horace, CNM

## 2017-11-16 LAB — GC/CHLAMYDIA PROBE AMP (~~LOC~~) NOT AT ARMC
CHLAMYDIA, DNA PROBE: NEGATIVE
Neisseria Gonorrhea: NEGATIVE

## 2017-11-17 LAB — STREP GP B NAA: Strep Gp B NAA: NEGATIVE

## 2017-11-21 ENCOUNTER — Encounter: Payer: Self-pay | Admitting: Family Medicine

## 2017-11-22 ENCOUNTER — Ambulatory Visit (INDEPENDENT_AMBULATORY_CARE_PROVIDER_SITE_OTHER): Payer: Medicaid Other | Admitting: Student

## 2017-11-22 ENCOUNTER — Encounter: Payer: Self-pay | Admitting: Student

## 2017-11-22 VITALS — BP 113/51 | HR 99 | Wt 205.0 lb

## 2017-11-22 DIAGNOSIS — Z98891 History of uterine scar from previous surgery: Secondary | ICD-10-CM

## 2017-11-22 DIAGNOSIS — Z3483 Encounter for supervision of other normal pregnancy, third trimester: Secondary | ICD-10-CM

## 2017-11-22 DIAGNOSIS — Z3480 Encounter for supervision of other normal pregnancy, unspecified trimester: Secondary | ICD-10-CM

## 2017-11-22 NOTE — Progress Notes (Signed)
   PRENATAL VISIT NOTE  Subjective:  Amy Mcpherson is a 32 y.o. [id number] at 3347w3d being seen today for ongoing prenatal care.  She is currently monitored for the following issues for this low-risk pregnancy and has Supervision of other normal pregnancy, antepartum; History of C-section x 2; Asthma; Rh negative state in antepartum period; and Depression on their problem list.  Patient reports no complaints.  Contractions: Irritability. Vag. Bleeding: None.  Movement: Present. Denies leaking of fluid.   The following portions of the patient's history were reviewed and updated as appropriate: allergies, current medications, past family history, past medical history, past social history, past surgical history and problem list. Problem list updated.  Objective:   Vitals:   11/22/17 1118  BP: (!) 113/51  Pulse: 99  Weight: 205 lb (93 kg)    Fetal Status: Fetal Heart Rate (bpm): 148 Fundal Height: 39 cm Movement: Present     General:  Alert, oriented and cooperative. Patient is in no acute distress.  Skin: Skin is warm and dry. No rash noted.   Cardiovascular: Normal heart rate noted  Respiratory: Normal respiratory effort, no problems with respiration noted  Abdomen: Soft, gravid, appropriate for gestational age.  Pain/Pressure: Present     Pelvic: Cervical exam deferred        Extremities: Normal range of motion.  Edema: Trace  Mental Status:  Normal mood and affect. Normal behavior. Normal judgment and thought content.   Assessment and Plan:  Pregnancy: [id number] at 3947w3d  1. Supervision of other normal pregnancy, antepartum -doing well -discussed contraception. Doesn't want LARCs or BTL. Used OCPs in the past & considering using again.   2. History of C-section x 2 -Scheduled for RCS on 3/4 with Dr. Macon LargeAnyanwu  Term labor symptoms and general obstetric precautions including but not limited to vaginal bleeding, contractions, leaking of fluid and fetal movement were reviewed in detail  with the patient. Please refer to After Visit Summary for other counseling recommendations.  No Follow-up on file.   Judeth HornErin Constantino Starace, NP

## 2017-11-22 NOTE — Patient Instructions (Addendum)
Contraception Choices Contraception, also called birth control, refers to methods or devices that prevent pregnancy. Hormonal methods Contraceptive implant A contraceptive implant is a thin, plastic tube that contains a hormone. It is inserted into the upper part of the arm. It can remain in place for up to 3 years. Progestin-only injections Progestin-only injections are injections of progestin, a synthetic form of the hormone progesterone. They are given every 3 months by a health care provider. Birth control pills Birth control pills are pills that contain hormones that prevent pregnancy. They must be taken once a day, preferably at the same time each day. Birth control patch The birth control patch contains hormones that prevent pregnancy. It is placed on the skin and must be changed once a week for three weeks and removed on the fourth week. A prescription is needed to use this method of contraception. Vaginal ring A vaginal ring contains hormones that prevent pregnancy. It is placed in the vagina for three weeks and removed on the fourth week. After that, the process is repeated with a new ring. A prescription is needed to use this method of contraception. Emergency contraceptive Emergency contraceptives prevent pregnancy after unprotected sex. They come in pill form and can be taken up to 5 days after sex. They work best the sooner they are taken after having sex. Most emergency contraceptives are available without a prescription. This method should not be used as your only form of birth control. Barrier methods Female condom A female condom is a thin sheath that is worn over the penis during sex. Condoms keep sperm from going inside a woman's body. They can be used with a spermicide to increase their effectiveness. They should be disposed after a single use. Female condom A female condom is a soft, loose-fitting sheath that is put into the vagina before sex. The condom keeps sperm from going  inside a woman's body. They should be disposed after a single use. Diaphragm A diaphragm is a soft, dome-shaped barrier. It is inserted into the vagina before sex, along with a spermicide. The diaphragm blocks sperm from entering the uterus, and the spermicide kills sperm. A diaphragm should be left in the vagina for 6-8 hours after sex and removed within 24 hours. A diaphragm is prescribed and fitted by a health care provider. A diaphragm should be replaced every 1-2 years, after giving birth, after gaining more than 15 lb (6.8 kg), and after pelvic surgery. Cervical cap A cervical cap is a round, soft latex or plastic cup that fits over the cervix. It is inserted into the vagina before sex, along with spermicide. It blocks sperm from entering the uterus. The cap should be left in place for 6-8 hours after sex and removed within 48 hours. A cervical cap must be prescribed and fitted by a health care provider. It should be replaced every 2 years. Sponge A sponge is a soft, circular piece of polyurethane foam with spermicide on it. The sponge helps block sperm from entering the uterus, and the spermicide kills sperm. To use it, you make it wet and then insert it into the vagina. It should be inserted before sex, left in for at least 6 hours after sex, and removed and thrown away within 30 hours. Spermicides Spermicides are chemicals that kill or block sperm from entering the cervix and uterus. They can come as a cream, jelly, suppository, foam, or tablet. A spermicide should be inserted into the vagina with an applicator at least 10-15 minutes before   sex to allow time for it to work. The process must be repeated every time you have sex. Spermicides do not require a prescription. Intrauterine contraception Intrauterine device (IUD) An IUD is a T-shaped device that is put in a woman's uterus. There are two types:  Hormone IUD.This type contains progestin, a synthetic form of the hormone progesterone. This  type can stay in place for 3-5 years.  Copper IUD.This type is wrapped in copper wire. It can stay in place for 10 years.  Permanent methods of contraception Female tubal ligation In this method, a woman's fallopian tubes are sealed, tied, or blocked during surgery to prevent eggs from traveling to the uterus. Hysteroscopic sterilization In this method, a small, flexible insert is placed into each fallopian tube. The inserts cause scar tissue to form in the fallopian tubes and block them, so sperm cannot reach an egg. The procedure takes about 3 months to be effective. Another form of birth control must be used during those 3 months. Female sterilization This is a procedure to tie off the tubes that carry sperm (vasectomy). After the procedure, the man can still ejaculate fluid (semen). Natural planning methods Natural family planning In this method, a couple does not have sex on days when the woman could become pregnant. Calendar method This means keeping track of the length of each menstrual cycle, identifying the days when pregnancy can happen, and not having sex on those days. Ovulation method In this method, a couple avoids sex during ovulation. Symptothermal method This method involves not having sex during ovulation. The woman typically checks for ovulation by watching changes in her temperature and in the consistency of cervical mucus. Post-ovulation method In this method, a couple waits to have sex until after ovulation. Summary  Contraception, also called birth control, means methods or devices that prevent pregnancy.  Hormonal methods of contraception include implants, injections, pills, patches, vaginal rings, and emergency contraceptives.  Barrier methods of contraception can include female condoms, female condoms, diaphragms, cervical caps, sponges, and spermicides.  There are two types of IUDs (intrauterine devices). An IUD can be put in a woman's uterus to prevent pregnancy  for 3-5 years.  Permanent sterilization can be done through a procedure for males, females, or both.  Natural family planning methods involve not having sex on days when the woman could become pregnant. This information is not intended to replace advice given to you by your health care provider. Make sure you discuss any questions you have with your health care provider. Document Released: 09/12/2005 Document Revised: 10/15/2016 Document Reviewed: 10/15/2016 Elsevier Interactive Patient Education  [employer].    Cesarean Delivery Cesarean birth, or cesarean delivery, is the surgical delivery of a baby through an incision in the abdomen and the uterus. This may be referred to as a C-section. This procedure may be scheduled ahead of time, or it may be done in an emergency situation. Tell a health care provider about:  Any allergies you have.  All medicines you are taking, including vitamins, herbs, eye drops, creams, and over-the-counter medicines.  Any problems you or family members have had with anesthetic medicines.  Any blood disorders you have.  Any surgeries you have had.  Any medical conditions you have.  Whether you or any members of your family have a history of deep vein thrombosis (DVT) or pulmonary embolism (PE). What are the risks? Generally, this is a safe procedure. However, problems may occur, including:  Infection.  Bleeding.  Allergic reactions to  medicines.  Damage to other structures or organs.  Blood clots.  Injury to your baby.  What happens before the procedure?  Follow instructions from your health care provider about eating or drinking restrictions.  Follow instructions from your health care provider about bathing before your procedure to help reduce your risk of infection.  If you know that you are going to have a cesarean delivery, do not shave your pubic area. Shaving before the procedure may increase your risk of infection.  Ask  your health care provider about: ? Changing or stopping your regular medicines. This is especially important if you are taking diabetes medicines or blood thinners. ? Your pain management plan. This is especially important if you plan to breastfeed your baby. ? How long you will be in the hospital after the procedure. ? Any concerns you may have about receiving blood products if you need them during the procedure. ? Cord blood banking, if you plan to collect your baby's umbilical cord blood.  You may also want to ask your health care provider: ? Whether you will be able to hold or breastfeed your baby while you are still in the operating room. ? Whether your baby can stay with you immediately after the procedure and during your recovery. ? Whether a family member or a person of your choice can go with you into the operating room and stay with you during the procedure, immediately after the procedure, and during your recovery.  Plan to have someone drive you home when you are discharged from the hospital. What happens during the procedure?  Fetal monitors will be placed on your abdomen to monitor your heart rate and your baby's heart rate.  Depending on the reason for your cesarean delivery, you may have a physical exam or additional testing, such as an ultrasound.  An IV tube will be inserted into one of your veins.  You may have your blood or urine tested.  You will be given antibiotic medicine to help prevent infection.  You may be given a special warming gown to wear to keep your temperature stable.  Hair may be removed from your pubic area.  The skin of your pubic area and lower abdomen will be cleaned with a germ-killing solution (antiseptic).  A catheter may be inserted into your bladder through your urethra. This drains your urine during the procedure.  You may be given one or more of the following: ? A medicine to numb the area (local anesthetic). ? A medicine to make you  fall asleep (general anesthetic). ? A medicine (regional anesthetic) that is injected into your back or through a small thin tube placed in your back (spinal anesthetic or epidural anesthetic). This numbs everything below the injection site and allows you to stay awake during your procedure. If this makes you feel nauseous, tell your health care provider. Medicines will be available to help reduce any nausea you may feel.  An incision will be made in your abdomen, and then in your uterus.  If you are awake during your procedure, you may feel tugging and pulling in your abdomen, but you should not feel pain. If you feel pain, tell your health care provider immediately.  Your baby will be removed from your uterus. You may feel more pressure or pushing while this happens.  Immediately after birth, your baby will be dried and kept warm. You may be able to hold and breastfeed your baby. The umbilical cord may be clamped and cut during  this time.  Your placenta will be removed from your uterus.  Your incisions will be closed with stitches (sutures). Staples, skin glue, or adhesive strips may also be applied to the incision in your abdomen.  Bandages (dressings) will be placed over the incision in your abdomen. The procedure may vary among health care providers and hospitals. What happens after the procedure?  Your blood pressure, heart rate, breathing rate, and blood oxygen level will be monitored often until the medicines you were given have worn off.  You may continue to receive fluids and medicines through an IV tube.  You will have some pain. Medicines will be available to help control your pain.  To help prevent blood clots: ? You may be given medicines. ? You may have to wear compression stockings or devices. ? You will be encouraged to walk around when you are able.  Hospital staff will encourage and support bonding with your baby. Your hospital may allow you and your baby to stay in  the same room (rooming in) during your hospital stay to encourage successful breastfeeding.  You may be encouraged to cough and breathe deeply often. This helps to prevent lung problems.  If you have a catheter draining your urine, it will be removed as soon as possible after your procedure. This information is not intended to replace advice given to you by your health care provider. Make sure you discuss any questions you have with your health care provider. Document Released: 09/12/2005 Document Revised: 02/18/2016 Document Reviewed: 06/23/2015 Elsevier Interactive Patient Education  [employer].

## 2017-11-23 NOTE — Patient Instructions (Signed)
Amy Mcpherson  11/23/2017   Your procedure is scheduled on:  11/27/2017  Enter through the Main Entrance of [facility] at 0930 AM.  Pick up the phone at the desk and dial [phone]  Call this number if you have problems the morning of surgery:[phone]  Remember:   Do not eat food:(After Midnight) Desps de medianoche.  Do not drink clear liquids: (After Midnight) Desps de medianoche.  Take these medicines the morning of surgery with A SIP OF WATER: bring your inhaler with you   Do not wear jewelry, make-up or nail polish.  Do not wear lotions, powders, or perfumes. Do not wear deodorant.  Do not shave 48 hours prior to surgery.  Do not bring valuables to the hospital.  [facility] is not   responsible for any belongings or valuables brought to the hospital.  Contacts, dentures or bridgework may not be worn into surgery.  Leave suitcase in the car. After surgery it may be brought to your room.  For patients admitted to the hospital, checkout time is 11:00 AM the day of              discharge.    N/A   Please read over the following fact sheets that you were given:   Surgical Site Infection Prevention

## 2017-11-24 ENCOUNTER — Encounter (HOSPITAL_COMMUNITY)
Admission: RE | Admit: 2017-11-24 | Discharge: 2017-11-24 | Disposition: A | Discharge status: Home / Self Care | Payer: Medicaid Other | Source: Ambulatory Visit | Attending: Obstetrics & Gynecology | Admitting: Obstetrics & Gynecology

## 2017-11-24 LAB — CBC
HEMATOCRIT: 32 % — AB (ref 36.0–46.0)
Hemoglobin: 10.6 g/dL — ABNORMAL LOW (ref 12.0–15.0)
MCH: 30.9 pg (ref 26.0–34.0)
MCHC: 33.1 g/dL (ref 30.0–36.0)
MCV: 93.3 fL (ref 78.0–100.0)
Platelets: 251 10*3/uL (ref 150–400)
RBC: 3.43 MIL/uL — ABNORMAL LOW (ref 3.87–5.11)
RDW: 15.8 % — AB (ref 11.5–15.5)
WBC: 10.4 10*3/uL (ref 4.0–10.5)

## 2017-11-24 LAB — ABO/RH: ABO/RH(D): B NEG

## 2017-11-24 LAB — STREP GP B SUSCEPTIBILITY

## 2017-11-24 LAB — TYPE AND SCREEN
ABO/RH(D): B NEG
ANTIBODY SCREEN: NEGATIVE

## 2017-11-24 LAB — SPECIMEN STATUS REPORT

## 2017-11-25 LAB — RPR: RPR Ser Ql: NONREACTIVE

## 2017-11-27 ENCOUNTER — Inpatient Hospital Stay (HOSPITAL_COMMUNITY): Payer: Medicaid Other | Admitting: Anesthesiology

## 2017-11-27 ENCOUNTER — Encounter (HOSPITAL_COMMUNITY)
Admission: RE | Disposition: A | Discharge status: Home / Self Care | Payer: Self-pay | Source: Ambulatory Visit | Attending: Obstetrics & Gynecology

## 2017-11-27 ENCOUNTER — Inpatient Hospital Stay (HOSPITAL_COMMUNITY)
Admission: RE | Admit: 2017-11-27 | Discharge: 2017-11-29 | DRG: 788 | Disposition: A | Discharge status: Home / Self Care | Payer: Medicaid Other | Source: Ambulatory Visit | Attending: Obstetrics & Gynecology | Admitting: Obstetrics & Gynecology

## 2017-11-27 ENCOUNTER — Other Ambulatory Visit: Payer: Self-pay

## 2017-11-27 ENCOUNTER — Encounter (HOSPITAL_COMMUNITY): Payer: Self-pay

## 2017-11-27 DIAGNOSIS — O26893 Other specified pregnancy related conditions, third trimester: Secondary | ICD-10-CM | POA: Diagnosis present

## 2017-11-27 DIAGNOSIS — Z87891 Personal history of nicotine dependence: Secondary | ICD-10-CM

## 2017-11-27 DIAGNOSIS — Z98891 History of uterine scar from previous surgery: Secondary | ICD-10-CM

## 2017-11-27 DIAGNOSIS — Z6791 Unspecified blood type, Rh negative: Secondary | ICD-10-CM

## 2017-11-27 DIAGNOSIS — O9952 Diseases of the respiratory system complicating childbirth: Secondary | ICD-10-CM | POA: Diagnosis present

## 2017-11-27 DIAGNOSIS — Z3A39 39 weeks gestation of pregnancy: Secondary | ICD-10-CM | POA: Diagnosis not present

## 2017-11-27 DIAGNOSIS — J45909 Unspecified asthma, uncomplicated: Secondary | ICD-10-CM | POA: Diagnosis present

## 2017-11-27 DIAGNOSIS — O34211 Maternal care for low transverse scar from previous cesarean delivery: Secondary | ICD-10-CM | POA: Diagnosis present

## 2017-11-27 SURGERY — Surgical Case
Anesthesia: Spinal

## 2017-11-27 MED ORDER — MORPHINE SULFATE (PF) 0.5 MG/ML IJ SOLN
INTRAMUSCULAR | Status: AC
  Filled 2017-11-27: qty 10

## 2017-11-27 MED ORDER — PRENATAL MULTIVITAMIN CH
1.0000 | ORAL_TABLET | Freq: Every day | ORAL | Status: DC
  Administered 2017-11-28 – 2017-11-29 (×2): 1 via ORAL
  Filled 2017-11-27 (×2): qty 1

## 2017-11-27 MED ORDER — ACETAMINOPHEN 500 MG PO TABS
1000.0000 mg | ORAL_TABLET | Freq: Four times a day (QID) | ORAL | Status: DC
  Administered 2017-11-27 – 2017-11-28 (×3): 1000 mg via ORAL
  Filled 2017-11-27 (×3): qty 2

## 2017-11-27 MED ORDER — LACTATED RINGERS IV SOLN
INTRAVENOUS | Status: DC | PRN
  Administered 2017-11-27: 13:00:00 via INTRAVENOUS

## 2017-11-27 MED ORDER — OXYTOCIN 40 UNITS IN LACTATED RINGERS INFUSION - SIMPLE MED: 2.5000 [IU]/h | INTRAVENOUS | Status: DC

## 2017-11-27 MED ORDER — DIPHENHYDRAMINE HCL 25 MG PO CAPS: 25.0000 mg | ORAL_CAPSULE | ORAL | Status: DC | PRN

## 2017-11-27 MED ORDER — TETANUS-DIPHTH-ACELL PERTUSSIS 5-2.5-18.5 LF-MCG/0.5 IM SUSP: 0.5000 mL | Freq: Once | INTRAMUSCULAR | Status: DC

## 2017-11-27 MED ORDER — ONDANSETRON HCL 4 MG/2ML IJ SOLN: 4.0000 mg | Freq: Three times a day (TID) | INTRAMUSCULAR | Status: DC | PRN

## 2017-11-27 MED ORDER — ACETAMINOPHEN 325 MG PO TABS: 650.0000 mg | ORAL_TABLET | ORAL | Status: DC | PRN

## 2017-11-27 MED ORDER — SIMETHICONE 80 MG PO CHEW
80.0000 mg | CHEWABLE_TABLET | ORAL | Status: DC
  Administered 2017-11-27 – 2017-11-29 (×2): 80 mg via ORAL
  Filled 2017-11-27 (×2): qty 1

## 2017-11-27 MED ORDER — DIPHENHYDRAMINE HCL 50 MG/ML IJ SOLN: 12.5000 mg | INTRAMUSCULAR | Status: DC | PRN

## 2017-11-27 MED ORDER — MORPHINE SULFATE (PF) 0.5 MG/ML IJ SOLN
INTRAMUSCULAR | Status: DC | PRN
  Administered 2017-11-27: .2 mg via INTRATHECAL

## 2017-11-27 MED ORDER — FENTANYL CITRATE (PF) 100 MCG/2ML IJ SOLN: 25.0000 ug | INTRAMUSCULAR | Status: DC | PRN

## 2017-11-27 MED ORDER — KETOROLAC TROMETHAMINE 30 MG/ML IJ SOLN: 30.0000 mg | Freq: Four times a day (QID) | INTRAMUSCULAR | Status: DC | PRN

## 2017-11-27 MED ORDER — WITCH HAZEL-GLYCERIN EX PADS: 1.0000 "application " | MEDICATED_PAD | CUTANEOUS | Status: DC | PRN

## 2017-11-27 MED ORDER — ALBUTEROL SULFATE (2.5 MG/3ML) 0.083% IN NEBU: 3.0000 mL | INHALATION_SOLUTION | Freq: Four times a day (QID) | RESPIRATORY_TRACT | Status: DC | PRN

## 2017-11-27 MED ORDER — ONDANSETRON HCL 4 MG/2ML IJ SOLN
INTRAMUSCULAR | Status: DC | PRN
  Administered 2017-11-27: 4 mg via INTRAVENOUS

## 2017-11-27 MED ORDER — SCOPOLAMINE 1 MG/3DAYS TD PT72
MEDICATED_PATCH | TRANSDERMAL | Status: AC
  Filled 2017-11-27: qty 1

## 2017-11-27 MED ORDER — ZOLPIDEM TARTRATE 5 MG PO TABS: 5.0000 mg | ORAL_TABLET | Freq: Every evening | ORAL | Status: DC | PRN

## 2017-11-27 MED ORDER — BUPIVACAINE IN DEXTROSE 0.75-8.25 % IT SOLN
INTRATHECAL | Status: DC | PRN
  Administered 2017-11-27: 1.4 mL via INTRATHECAL

## 2017-11-27 MED ORDER — LACTATED RINGERS IV SOLN
INTRAVENOUS | Status: DC
  Administered 2017-11-27 (×3): via INTRAVENOUS

## 2017-11-27 MED ORDER — SCOPOLAMINE 1 MG/3DAYS TD PT72: 1.0000 | MEDICATED_PATCH | TRANSDERMAL | Status: DC

## 2017-11-27 MED ORDER — ENOXAPARIN SODIUM 40 MG/0.4ML ~~LOC~~ SOLN
40.0000 mg | SUBCUTANEOUS | Status: DC
  Administered 2017-11-28 – 2017-11-29 (×2): 40 mg via SUBCUTANEOUS
  Filled 2017-11-27 (×3): qty 0.4

## 2017-11-27 MED ORDER — BUPIVACAINE HCL (PF) 0.5 % IJ SOLN
INTRAMUSCULAR | Status: DC | PRN
  Administered 2017-11-27: 30 mL

## 2017-11-27 MED ORDER — IBUPROFEN 600 MG PO TABS
600.0000 mg | ORAL_TABLET | Freq: Four times a day (QID) | ORAL | Status: DC
  Administered 2017-11-27 – 2017-11-29 (×7): 600 mg via ORAL
  Filled 2017-11-27 (×7): qty 1

## 2017-11-27 MED ORDER — SODIUM CHLORIDE 0.9% FLUSH: 3.0000 mL | INTRAVENOUS | Status: DC | PRN

## 2017-11-27 MED ORDER — PHENYLEPHRINE 8 MG IN D5W 100 ML (0.08MG/ML) PREMIX OPTIME
INJECTION | INTRAVENOUS | Status: DC | PRN
  Administered 2017-11-27: 60 ug/min via INTRAVENOUS

## 2017-11-27 MED ORDER — COCONUT OIL OIL: 1.0000 "application " | TOPICAL_OIL | Status: DC | PRN

## 2017-11-27 MED ORDER — SCOPOLAMINE 1 MG/3DAYS TD PT72: 1.0000 | MEDICATED_PATCH | Freq: Once | TRANSDERMAL | Status: DC

## 2017-11-27 MED ORDER — BUPIVACAINE HCL (PF) 0.5 % IJ SOLN
INTRAMUSCULAR | Status: AC
  Filled 2017-11-27: qty 30

## 2017-11-27 MED ORDER — NALOXONE HCL 0.4 MG/ML IJ SOLN: 0.4000 mg | INTRAMUSCULAR | Status: DC | PRN

## 2017-11-27 MED ORDER — KETOROLAC TROMETHAMINE 30 MG/ML IJ SOLN
INTRAMUSCULAR | Status: AC
  Filled 2017-11-27: qty 1

## 2017-11-27 MED ORDER — DIBUCAINE 1 % RE OINT: 1.0000 "application " | TOPICAL_OINTMENT | RECTAL | Status: DC | PRN

## 2017-11-27 MED ORDER — DIPHENHYDRAMINE HCL 25 MG PO CAPS: 25.0000 mg | ORAL_CAPSULE | Freq: Four times a day (QID) | ORAL | Status: DC | PRN

## 2017-11-27 MED ORDER — FERROUS SULFATE 325 (65 FE) MG PO TABS
325.0000 mg | ORAL_TABLET | Freq: Two times a day (BID) | ORAL | Status: DC
  Administered 2017-11-27 – 2017-11-29 (×4): 325 mg via ORAL
  Filled 2017-11-27 (×4): qty 1

## 2017-11-27 MED ORDER — NALBUPHINE HCL 10 MG/ML IJ SOLN: 5.0000 mg | INTRAMUSCULAR | Status: DC | PRN

## 2017-11-27 MED ORDER — SODIUM CHLORIDE 0.9 % IR SOLN
Status: DC | PRN
  Administered 2017-11-27: 1

## 2017-11-27 MED ORDER — SENNOSIDES-DOCUSATE SODIUM 8.6-50 MG PO TABS
2.0000 | ORAL_TABLET | ORAL | Status: DC
  Administered 2017-11-27 – 2017-11-29 (×2): 2 via ORAL
  Filled 2017-11-27 (×2): qty 2

## 2017-11-27 MED ORDER — OXYCODONE-ACETAMINOPHEN 5-325 MG PO TABS: 2.0000 | ORAL_TABLET | ORAL | Status: DC | PRN

## 2017-11-27 MED ORDER — OXYCODONE-ACETAMINOPHEN 5-325 MG PO TABS
1.0000 | ORAL_TABLET | ORAL | Status: DC | PRN
  Administered 2017-11-28 – 2017-11-29 (×4): 1 via ORAL
  Filled 2017-11-27 (×4): qty 1

## 2017-11-27 MED ORDER — KETOROLAC TROMETHAMINE 30 MG/ML IJ SOLN
30.0000 mg | Freq: Four times a day (QID) | INTRAMUSCULAR | Status: DC | PRN
  Administered 2017-11-27: 30 mg via INTRAMUSCULAR

## 2017-11-27 MED ORDER — FENTANYL CITRATE (PF) 100 MCG/2ML IJ SOLN
INTRAMUSCULAR | Status: AC
  Filled 2017-11-27: qty 2

## 2017-11-27 MED ORDER — DEXTROSE 5 % IV SOLN: 1.0000 ug/kg/h | INTRAVENOUS | Status: DC | PRN

## 2017-11-27 MED ORDER — OXYTOCIN 10 UNIT/ML IJ SOLN
INTRAMUSCULAR | Status: DC | PRN
  Administered 2017-11-27: 40 [IU] via INTRAVENOUS

## 2017-11-27 MED ORDER — SOD CITRATE-CITRIC ACID 500-334 MG/5ML PO SOLN
30.0000 mL | ORAL | Status: AC
  Administered 2017-11-27: 30 mL via ORAL
  Filled 2017-11-27: qty 15

## 2017-11-27 MED ORDER — MEASLES, MUMPS & RUBELLA VAC ~~LOC~~ INJ: 0.5000 mL | INJECTION | Freq: Once | SUBCUTANEOUS | Status: DC

## 2017-11-27 MED ORDER — LACTATED RINGERS IV SOLN
INTRAVENOUS | Status: DC
  Administered 2017-11-27 – 2017-11-28 (×2): via INTRAVENOUS

## 2017-11-27 MED ORDER — ONDANSETRON HCL 4 MG/2ML IJ SOLN
INTRAMUSCULAR | Status: AC
  Filled 2017-11-27: qty 2

## 2017-11-27 MED ORDER — NALBUPHINE HCL 10 MG/ML IJ SOLN: 5.0000 mg | Freq: Once | INTRAMUSCULAR | Status: DC | PRN

## 2017-11-27 MED ORDER — SODIUM CHLORIDE 0.9 % IV SOLN
2.0000 g | INTRAVENOUS | Status: AC
  Administered 2017-11-27: 2 g via INTRAVENOUS
  Filled 2017-11-27: qty 2

## 2017-11-27 MED ORDER — FENTANYL CITRATE (PF) 100 MCG/2ML IJ SOLN
INTRAMUSCULAR | Status: DC | PRN
  Administered 2017-11-27: 10 ug via INTRATHECAL

## 2017-11-27 MED ORDER — MENTHOL 3 MG MT LOZG: 1.0000 | LOZENGE | OROMUCOSAL | Status: DC | PRN

## 2017-11-27 MED ORDER — SIMETHICONE 80 MG PO CHEW: 80.0000 mg | CHEWABLE_TABLET | ORAL | Status: DC | PRN

## 2017-11-27 MED ORDER — MAGNESIUM HYDROXIDE 400 MG/5ML PO SUSP: 30.0000 mL | ORAL | Status: DC | PRN

## 2017-11-27 MED ORDER — MEPERIDINE HCL 25 MG/ML IJ SOLN: 6.2500 mg | INTRAMUSCULAR | Status: DC | PRN

## 2017-11-27 MED ORDER — OXYTOCIN 10 UNIT/ML IJ SOLN
INTRAMUSCULAR | Status: AC
  Filled 2017-11-27: qty 4

## 2017-11-27 MED ORDER — CALCIUM CARBONATE ANTACID 500 MG PO CHEW: 1.0000 | CHEWABLE_TABLET | Freq: Three times a day (TID) | ORAL | Status: DC | PRN

## 2017-11-27 SURGICAL SUPPLY — 33 items
BENZOIN TINCTURE PRP APPL 2/3 (GAUZE/BANDAGES/DRESSINGS) ×3 IMPLANT
CHLORAPREP W/TINT 26ML (MISCELLANEOUS) ×3 IMPLANT
CLAMP CORD UMBIL (MISCELLANEOUS) ×3 IMPLANT
CLOSURE WOUND 1/2 X4 (GAUZE/BANDAGES/DRESSINGS) ×1
CLOTH BEACON ORANGE TIMEOUT ST (SAFETY) ×3 IMPLANT
DRSG OPSITE POSTOP 4X10 (GAUZE/BANDAGES/DRESSINGS) ×3 IMPLANT
ELECT REM PT RETURN 9FT ADLT (ELECTROSURGICAL) ×3
ELECTRODE REM PT RTRN 9FT ADLT (ELECTROSURGICAL) ×1 IMPLANT
EXTRACTOR VACUUM M CUP 4 TUBE (SUCTIONS) IMPLANT
EXTRACTOR VACUUM M CUP 4' TUBE (SUCTIONS)
GLOVE BIOGEL PI IND STRL 7.0 (GLOVE) ×3 IMPLANT
GLOVE BIOGEL PI INDICATOR 7.0 (GLOVE) ×6
GLOVE ECLIPSE 7.0 STRL STRAW (GLOVE) ×3 IMPLANT
GOWN STRL REUS W/TWL LRG LVL3 (GOWN DISPOSABLE) ×6 IMPLANT
HOVERMATT SINGLE USE (MISCELLANEOUS) ×3 IMPLANT
KIT ABG SYR 3ML LUER SLIP (SYRINGE) IMPLANT
NEEDLE HYPO 22GX1.5 SAFETY (NEEDLE) ×3 IMPLANT
NEEDLE HYPO 25X5/8 SAFETYGLIDE (NEEDLE) ×3 IMPLANT
NS IRRIG 1000ML POUR BTL (IV SOLUTION) ×3 IMPLANT
PACK C SECTION WH (CUSTOM PROCEDURE TRAY) ×3 IMPLANT
PAD ABD 7.5X8 STRL (GAUZE/BANDAGES/DRESSINGS) ×3 IMPLANT
PAD OB MATERNITY 4.3X12.25 (PERSONAL CARE ITEMS) ×3 IMPLANT
PENCIL SMOKE EVAC W/HOLSTER (ELECTROSURGICAL) ×3 IMPLANT
RTRCTR C-SECT PINK 25CM LRG (MISCELLANEOUS) IMPLANT
STRIP CLOSURE SKIN 1/2X4 (GAUZE/BANDAGES/DRESSINGS) ×2 IMPLANT
SUT PDS AB 0 CTX 36 PDP370T (SUTURE) IMPLANT
SUT PLAIN 2 0 XLH (SUTURE) IMPLANT
SUT VIC AB 0 CTX 36 (SUTURE) ×6
SUT VIC AB 0 CTX36XBRD ANBCTRL (SUTURE) ×3 IMPLANT
SUT VIC AB 4-0 KS 27 (SUTURE) ×3 IMPLANT
SYR CONTROL 10ML LL (SYRINGE) ×3 IMPLANT
TOWEL OR 17X24 6PK STRL BLUE (TOWEL DISPOSABLE) ×3 IMPLANT
TRAY FOLEY BAG SILVER LF 14FR (SET/KITS/TRAYS/PACK) ×3 IMPLANT

## 2017-11-27 NOTE — Op Note (Signed)
Amy CongoAnjelissa Mcpherson PROCEDURE DATE: 11/27/2017  PREOPERATIVE DIAGNOSES: Intrauterine pregnancy at 6230w1d weeks gestation; previous cesarean section x 2  POSTOPERATIVE DIAGNOSES: The same  PROCEDURE: Repeat Low Transverse Cesarean Section  SURGEON:  Dr. Jaynie CollinsUgonna Rhythm Wigfall  ASSISTANT:  Dr. Caryl AdaJazma Phelps  ANESTHESIOLOGY TEAM: Anesthesiologist: Marcene DuosFitzgerald, Robert, MD CRNA: Shanon PayorGregory, Suzanne M, CRNA  INDICATIONS: Amy Mcpherson is a 32 y.o. [phone] at 5430w1d here for cesarean section secondary to the indications listed under preoperative diagnoses; please see preoperative note for further details.  The risks of cesarean section were discussed with the patient including but were not limited to: bleeding which may require transfusion or reoperation; infection which may require antibiotics; injury to bowel, bladder, ureters or other surrounding organs; injury to the fetus; need for additional procedures including hysterectomy in the event of a life-threatening hemorrhage; placental abnormalities wth subsequent pregnancies, incisional problems, thromboembolic phenomenon and other postoperative/anesthesia complications.   The patient concurred with the proposed plan, giving informed written consent for the procedure.    FINDINGS:  Viable female infant in cephalic presentation.  Apgars 8 and 8.  Clear amniotic fluid.  Intact placenta, three vessel cord.  Normal uterus, fallopian tubes and ovaries bilaterally. Very thin lower uterine segment.  Minimal intraperitoneal adhesive disease.  ANESTHESIA: Spinal INTRAVENOUS FLUIDS: 2300 ml   ESTIMATED BLOOD LOSS: 720 ml URINE OUTPUT:  150 ml SPECIMENS: Placenta sent to L&D COMPLICATIONS: None immediate  PROCEDURE IN DETAIL:  The patient preoperatively received intravenous antibiotics and had sequential compression devices applied to her lower extremities.  She was then taken to the operating room where spinal anesthesia was administered and was found to be adequate. She  was then placed in a dorsal supine position with a leftward tilt, and prepped and draped in a sterile manner.  A foley catheter was placed into her bladder and attached to constant gravity.  After an adequate timeout was performed, a Pfannenstiel skin incision was made with scalpel over her preexisting scar and carried through to the underlying layer of fascia. The fascia was incised in the midline, and this incision was extended bilaterally using the Mayo scissors.  Kocher clamps were applied to the superior aspect of the fascial incision and the underlying rectus muscles were dissected off bluntly.  A similar process was carried out on the inferior aspect of the fascial incision. The rectus muscles were separated in the midline bluntly and the peritoneum was entered bluntly. Attention was turned to the lower uterine segment where a low transverse hysterotomy was made with a scalpel and extended bilaterally bluntly.  The infant was successfully delivered, the cord was clamped and cut after one minute, and the infant was handed over to the awaiting neonatology team. Uterine massage was then administered, and the placenta delivered intact with a three-vessel cord. The uterus was then cleared of clots and debris.  The hysterotomy was closed with 0 Vicryl in a running locked fashion, and an imbricating layer was also placed with 0 Vicryl.  Figure-of-eight 0 Vicryl serosal stitches were placed to help with hemostasis; also to oversew a few very thin areas on the lower uterine segment.  The pelvis was cleared of all clot and debris. Hemostasis was confirmed on all surfaces.  The peritoneum was closed with a 0 Vicryl running stitch. The fascia was then closed using 0 PDS in a running fashion.  The subcutaneous layer was irrigated, and 30 ml of 0.5% Marcaine was injected subcutaneously around the incision.  The skin was closed with a 4-0 Vicryl subcuticular  stitch. The patient tolerated the procedure well. Sponge, lap,  instrument and needle counts were correct x 3.  She was taken to the recovery room in stable condition.    Verita Schneiders, MD, [place] for [employer], [place]

## 2017-11-27 NOTE — H&P (Signed)
Obstetric Preoperative History and Physical  Amy Mcpherson is a 32 y.o. [id number] with IUP at 1822w1d presenting for scheduled cesarean section. Previous section x2.  No acute concerns.    Prenatal Course Source of Care: [facility] with onset of care at 19 weeks Pregnancy complications or risks: Patient Active Problem List   Diagnosis Date Noted  . Supervision of other normal pregnancy, antepartum 07/13/2017  . History of C-section x 2 07/13/2017  . Asthma 07/13/2017  . Rh negative state in antepartum period 07/13/2017  . Depression 07/13/2017   She plans to breastfeed She desires oral contraceptives (estrogen/progesterone) for postpartum contraception.   Prenatal labs and studies: ABO, Rh: --/--/B NEG, B NEG Performed at [facility], [address] [place], [place] [address]  [phone](03/01 1035) Antibody: NEG (03/01 1035) Rubella: Immune (07/13 0000) RPR: Non Reactive (03/01 1035)  HBsAg:   Negative HIV: Non Reactive (12/12 1012)  GNF:AOZHYQMVGBS:Negative (02/20 1128) 2 hr Glucola  wnl Genetic screening normal Anatomy US normal  Prenatal Transfer Tool  Maternal Diabetes: No Genetic Screening: Normal Maternal Ultrasounds/Referrals: Normal Fetal Ultrasounds or other Referrals:  None Maternal Substance Abuse:  No Significant Maternal Medications:  None Significant Maternal Lab Results: Lab values include: Rh negative  Past Medical History:  Diagnosis Date  . Asthma   . Depression    pre and post partum took medication it helped    Past Surgical History:  Procedure Laterality Date  . CESAREAN SECTION    . LESION REMOVAL  2008    OB History  Gravida Para Term Preterm AB Living  6 2 2  0 3 2  SAB TAB Ectopic Multiple Live Births  1 2 0 0 2    # Outcome Date GA Lbr Len/2nd Weight Sex Delivery Anes PTL Lv  6 Current           5 SAB 2017          4 Term 01/15/13 382w0d  3.09 kg (6 lb 13 oz) F CS-Unspec   LIV  3 Term 10/01/08 4582w0d  3.572 kg (7 lb 14 oz) M CS-Unspec  N LIV   Birth Comments: umbilical cord around neck  2 TAB           1 TAB               Social History   Socioeconomic History  . Marital status: Single    Spouse name: None  . Number of children: None  . Years of education: None  . Highest education level: None  Social Needs  . Financial resource strain: None  . Food insecurity - worry: None  . Food insecurity - inability: None  . Transportation needs - medical: None  . Transportation needs - non-medical: None  Occupational History  . None  Tobacco Use  . Smoking status: Former Games developermoker  . Smokeless tobacco: Never Used  Substance and Sexual Activity  . Alcohol use: No  . Drug use: No  . Sexual activity: Yes  Other Topics Concern  . None  Social History Narrative  . None    Family History  Problem Relation Age of Onset  . Heart disease Mother   . Hypertension Mother     Medications Prior to Admission  Medication Sig Dispense Refill Last Dose  . albuterol (PROVENTIL HFA;VENTOLIN HFA) 108 (90 Base) MCG/ACT inhaler Inhale 2 puffs into the lungs every 6 (six) hours as needed for wheezing or shortness of breath.    Taking  . calcium carbonate (TUMS -  DOSED IN MG ELEMENTAL CALCIUM) 500 MG chewable tablet Chew 1 tablet by mouth 3 (three) times daily as needed for indigestion or heartburn.   Taking  . Prenatal Vit-Fe Fumarate-FA (PRENATAL MULTIVITAMIN) TABS tablet Take 1 tablet by mouth daily at 12 noon.   Taking    Allergies  Allergen Reactions  . No Known Allergies     Review of Systems: Pertinent items noted in HPI and remainder of comprehensive ROS otherwise negative.  Physical Exam: BP 104/65 (BP Location: Right Arm)   Pulse (!) 103   Temp 98.5 F (36.9 C) (Oral)   Resp 17   Ht 5' (1.524 m)   Wt 93.4 kg (206 lb)   LMP 02/26/2017 (Exact Date)   BMI 40.23 kg/m  FHR by Doppler: 142 bpm CONSTITUTIONAL: Well-developed, well-nourished female in no acute distress.  HENT:  Normocephalic, atraumatic, External right and  left ear normal. Oropharynx is clear and moist EYES: Conjunctivae and EOM are normal. Pupils are equal, round, and reactive to light. No scleral icterus.  NECK: Normal range of motion, supple, no masses SKIN: Skin is warm and dry. No rash noted. Not diaphoretic. No erythema. No pallor. NEUROLGIC: Alert and oriented to person, place, and time. Normal reflexes, muscle tone coordination. No cranial nerve deficit noted. PSYCHIATRIC: Normal mood and affect. Normal behavior. Normal judgment and thought content. CARDIOVASCULAR: Normal heart rate noted, regular rhythm RESPIRATORY: Effort and breath sounds normal, no problems with respiration noted ABDOMEN: Soft, nontender, nondistended, gravid. Well-healed Pfannenstiel incision. PELVIC: Deferred MUSCULOSKELETAL: Normal range of motion. No edema and no tenderness. 2+ distal pulses.   Pertinent Labs/Studies:   Results for orders placed or performed during the hospital encounter of 11/27/17 (from the past 72 hour(s))  ABO/Rh     Status: None   Collection Time: 11/24/17 10:35 AM  Result Value Ref Range   ABO/RH(D)      B NEG Performed at [facility], [address], [place], [place] [address]     Assessment and Plan :Amy Mcpherson is a 32 y.o. [id number] at [redacted]w[redacted]d being admitted for scheduled cesarean section. The risks of cesarean section discussed with the patient included but were not limited to: bleeding which may require transfusion or reoperation; infection which may require antibiotics; injury to bowel, bladder, ureters or other surrounding organs; injury to the fetus; need for additional procedures including hysterectomy in the event of a life-threatening hemorrhage; placental abnormalities wth subsequent pregnancies, incisional problems, thromboembolic phenomenon and other postoperative/anesthesia complications. The patient concurred with the proposed plan, giving informed written consent for the procedure. Patient has been NPO since last  night she will remain NPO for procedure. Anesthesia and OR aware. Preoperative prophylactic antibiotics and SCDs ordered on call to the OR. To OR when ready.    Caryl Ada, DO OB Fellow Center for [facility], [facility]

## 2017-11-27 NOTE — Addendum Note (Signed)
Addendum  created 11/27/17 2151 by Shanon PayorGregory, Myrical Andujo M, CRNA   Sign clinical note

## 2017-11-27 NOTE — Anesthesia Postprocedure Evaluation (Signed)
Anesthesia Post Note  Patient: Amy CongoAnjelissa Elman  Procedure(s) Performed: CESAREAN SECTION (N/A )     Patient location during evaluation: PACU Anesthesia Type: Spinal Level of consciousness: awake and alert Pain management: pain level controlled Vital Signs Assessment: post-procedure vital signs reviewed and stable Respiratory status: spontaneous breathing and respiratory function stable Cardiovascular status: blood pressure returned to baseline and stable Postop Assessment: spinal receding Anesthetic complications: no    Last Vitals:  Vitals:   11/27/17 1445 11/27/17 1512  BP: (!) 105/50 (!) 107/42  Pulse: 68 74  Resp: 18 18  Temp: 36.6 C 36.5 C  SpO2: 100% 99%    Last Pain:  Vitals:   11/27/17 1512  TempSrc: Oral  PainSc:    Pain Goal:                 Kennieth RadFitzgerald, Kendal Ghazarian E

## 2017-11-27 NOTE — Anesthesia Preprocedure Evaluation (Signed)
Anesthesia Evaluation  Patient identified by MRN, date of birth, ID band Patient awake    Reviewed: Allergy & Precautions, NPO status , Patient's Chart, lab work & pertinent test results  Airway Mallampati: II  TM Distance: >3 FB Neck ROM: Full    Dental  (+) Dental Advisory Given   Pulmonary asthma , former smoker,    Pulmonary exam normal        Cardiovascular negative cardio ROS Normal cardiovascular exam     Neuro/Psych Depression    GI/Hepatic negative GI ROS, Neg liver ROS,   Endo/Other  negative endocrine ROS  Renal/GU negative Renal ROS     Musculoskeletal   Abdominal   Peds  Hematology negative hematology ROS (+)   Anesthesia Other Findings   Reproductive/Obstetrics (+) Pregnancy                             Lab Results  Component Value Date   WBC 10.4 11/24/2017   HGB 10.6 (L) 11/24/2017   HCT 32.0 (L) 11/24/2017   MCV 93.3 11/24/2017   PLT 251 11/24/2017   No results found for: CREATININE, BUN, NA, K, CL, CO2  Anesthesia Physical Anesthesia Plan  ASA: III  Anesthesia Plan: Spinal   Post-op Pain Management:    Induction: Intravenous  PONV Risk Score and Plan: 3 and Treatment may vary due to age or medical condition, Scopolamine patch - Pre-op, Ondansetron and Metaclopromide  Airway Management Planned: Natural Airway  Additional Equipment:   Intra-op Plan:   Post-operative Plan:   Informed Consent: I have reviewed the patients History and Physical, chart, labs and discussed the procedure including the risks, benefits and alternatives for the proposed anesthesia with the patient or authorized representative who has indicated his/her understanding and acceptance.     Plan Discussed with: CRNA  Anesthesia Plan Comments:         Anesthesia Quick Evaluation

## 2017-11-27 NOTE — Transfer of Care (Signed)
Immediate Anesthesia Transfer of Care Note  Patient: Amy CongoAnjelissa Mcpherson  Procedure(s) Performed: CESAREAN SECTION (N/A )  Patient Location: PACU  Anesthesia Type:Spinal  Level of Consciousness: awake, alert  and oriented  Airway & Oxygen Therapy: Patient Spontanous Breathing  Post-op Assessment: Report given to RN and Post -op Vital signs reviewed and stable  Post vital signs: Reviewed and stable  Last Vitals:  Vitals:   11/27/17 0945  BP: 104/65  Pulse: (!) 103  Resp: 17  Temp: 36.9 C    Last Pain:  Vitals:   11/27/17 0945  TempSrc: Oral         Complications: No apparent anesthesia complications

## 2017-11-27 NOTE — Anesthesia Postprocedure Evaluation (Signed)
Anesthesia Post Note  Patient: Amy CongoAnjelissa Mcpherson  Procedure(s) Performed: CESAREAN SECTION (N/A )     Patient location during evaluation: Mother Baby Anesthesia Type: Spinal Level of consciousness: awake and alert and oriented Pain management: satisfactory to patient Vital Signs Assessment: post-procedure vital signs reviewed and stable Respiratory status: spontaneous breathing and nonlabored ventilation Cardiovascular status: stable Postop Assessment: no headache, no backache, patient able to bend at knees, no signs of nausea or vomiting and adequate PO intake Anesthetic complications: no    Last Vitals:  Vitals:   11/27/17 1832 11/27/17 2100  BP: (!) 103/52   Pulse: 74   Resp: 18   Temp: 36.5 C 36.9 C  SpO2: 100% 98%    Last Pain:  Vitals:   11/27/17 2100  TempSrc: Oral  PainSc: 0-No pain   Pain Goal:                 Delaine Hernandez

## 2017-11-27 NOTE — Anesthesia Procedure Notes (Signed)
Spinal  Start time: 11/27/2017 12:20 PM End time: 11/27/2017 12:24 PM Staffing Anesthesiologist: Marcene DuosFitzgerald, Seibert Keeter, MD Performed: anesthesiologist  Preanesthetic Checklist Completed: patient identified, site marked, surgical consent, pre-op evaluation, timeout performed, IV checked, risks and benefits discussed and monitors and equipment checked Spinal Block Patient position: sitting Prep: site prepped and draped and DuraPrep Patient monitoring: blood pressure, continuous pulse ox and heart rate Approach: midline Location: L3-4 Injection technique: single-shot Needle Needle type: Pencan  Needle gauge: 24 G Needle length: 9 cm Assessment Sensory level: T4

## 2017-11-28 ENCOUNTER — Other Ambulatory Visit: Payer: Self-pay

## 2017-11-28 ENCOUNTER — Encounter (HOSPITAL_COMMUNITY): Payer: Self-pay | Admitting: *Deleted

## 2017-11-28 LAB — CBC
HCT: 26.3 % — ABNORMAL LOW (ref 36.0–46.0)
Hemoglobin: 8.9 g/dL — ABNORMAL LOW (ref 12.0–15.0)
MCH: 30.8 pg (ref 26.0–34.0)
MCHC: 33.8 g/dL (ref 30.0–36.0)
MCV: 91 fL (ref 78.0–100.0)
PLATELETS: 205 10*3/uL (ref 150–400)
RBC: 2.89 MIL/uL — ABNORMAL LOW (ref 3.87–5.11)
RDW: 15.3 % (ref 11.5–15.5)
WBC: 9.7 10*3/uL (ref 4.0–10.5)

## 2017-11-28 LAB — CREATININE, SERUM
CREATININE: 0.64 mg/dL (ref 0.44–1.00)
GFR calc Af Amer: >60 mL/min (ref >60)
GFR calc non Af Amer: >60 mL/min (ref >60)

## 2017-11-28 MED ORDER — FERROUS SULFATE 325 (65 FE) MG PO TABS: 325.0000 mg | ORAL_TABLET | Freq: Every day | ORAL | Status: DC

## 2017-11-28 MED ORDER — RHO D IMMUNE GLOBULIN 1500 UNIT/2ML IJ SOSY
300.0000 ug | PREFILLED_SYRINGE | Freq: Once | INTRAMUSCULAR | Status: AC
  Administered 2017-11-28: 300 ug via INTRAVENOUS
  Filled 2017-11-28: qty 2

## 2017-11-28 NOTE — Lactation Note (Signed)
This note was copied from a baby's chart. Lactation Consultation Note  Patient Name: Amy Mcpherson ZOXWR'UToday's Date: 11/28/2017 Reason for consult: Initial assessment   P3, Baby 24 hours old.  BF first child 3.5 mos.  Mother trying to latch baby on R side. Provided mother with manual pump to prepump if needed. Reviewed hand expression with drops expressed.. Mother repositioned baby to cradle hold and was able to sustain latch on R side. She states baby has been latching well on L side. Intermittent sucks and swallows observed. Mom encouraged to feed baby 8-12 times/24 hours and with feeding cues.  Mom made aware of O/P services, breastfeeding support groups, community resources, and our phone # for post-discharge questions.       Maternal Data Has patient been taught Hand Expression?: Yes Does the patient have breastfeeding experience prior to this delivery?: Yes  Feeding Feeding Type: Breast Fed  LATCH Score Latch: Repeated attempts needed to sustain latch, nipple held in mouth throughout feeding, stimulation needed to elicit sucking reflex.  Audible Swallowing: A few with stimulation  Type of Nipple: Everted at rest and after stimulation  Comfort (Breast/Nipple): Soft / non-tender  Hold (Positioning): Assistance needed to correctly position infant at breast and maintain latch.  LATCH Score: 7  Interventions Interventions: Breast feeding basics reviewed;Assisted with latch;Breast compression;Hand express  Lactation Tools Discussed/Used     Consult Status Consult Status: Follow-up Date: 11/29/17 Follow-up type: In-patient    Dahlia ByesBerkelhammer, Ladanian Kelter [facility] 11/28/2017, 1:39 PM

## 2017-11-28 NOTE — Progress Notes (Signed)
Subjective: Postpartum Day 1: Cesarean Delivery Patient reports incisional pain, tolerating PO and no problems voiding.    Objective: Vital signs in last 24 hours: Temp:  [97.5 F (36.4 C)-98.6 F (37 C)] 98.6 F (37 C) (03/05 0505) Pulse Rate:  [65-103] 79 (03/05 0505) Resp:  [13-20] 18 (03/05 0505) BP: (93-114)/(40-65) 93/46 (03/05 0505) SpO2:  [95 %-100 %] 98 % (03/05 0700) Weight:  [206 lb (93.4 kg)] 206 lb (93.4 kg) (03/04 0950)  Physical Exam:  General: alert, cooperative and no distress Lochia: appropriate Uterine Fundus: firm Incision: healing well DVT Evaluation: No evidence of DVT seen on physical exam.  Recent Labs    11/28/17 0532  HGB 8.9*  HCT 26.3*    Assessment/Plan: Status post Cesarean section. Doing well postoperatively. Hgb 8.9 this am, already started on iron supplement and asymptomatic.  Continue current care.  Amy Mcpherson CNM 11/28/2017, 7:25 AM

## 2017-11-28 NOTE — Progress Notes (Addendum)
  CSW received consult for hx of Anxiety/ Depression and EDPS score of 11. CSW met with MOB to offer support and complete assessment.  When CSW arrived, MOB appeared happy and was engaging in skin to skin. MOB was polite and receptive to meeting with CSW.   CSW asked about MOB's MH and MOB acknowledged a hx of anxiety and depression.  MOB reported that MOB is not currently on any medications and meets with integrated care CSW during scheduled OB appointment; per [facility] meetings have been helpful.   CSW also reviewed MOB's responses to MOB's EDPS and processed ways to assist MOB with increasing her mood and decrease feelings of being overwhelmed. MOB shared that interventions have been brought to MOB's attention by integrated care CSW.   CSW provided education regarding the baby blues period vs. perinatal mood disorders, discussed treatment and gave resources for mental health follow up if concerns arise.  CSW recommends self-evaluation during the postpartum time period using the New Mom Checklist from Postpartum Progress and encouraged MOB to contact a medical professional if symptoms are noted at any time. MOB recognized that MOB experienced PPD signs and symptoms with MOB's 2nd child but did not seek help. CSW assessed for safety and MOB denied SI, HI, and DV.  MOB presented with insight and awareness and did not present with any acute symptoms.  CSW offered MOB resources for outpatient counseling and MOB declined.  MOB stated, "I received resources from the therapist at the clinic."   CSW provided review of Sudden Infant Death Syndrome (SIDS) precautions.    CSW identifies no further need for intervention and no barriers to discharge at this time.  Laurey Arrow, MSW, LCSW Clinical Social Work [phone]

## 2017-11-29 LAB — RH IG WORKUP (INCLUDES ABO/RH)
ABO/RH(D): B NEG
Antibody Screen: NEGATIVE
Fetal Screen: NEGATIVE
Gestational Age(Wks): 39.1
Unit division: 0

## 2017-11-29 MED ORDER — IBUPROFEN 600 MG PO TABS: 600.0000 mg | ORAL_TABLET | Freq: Four times a day (QID) | ORAL | 0 refills | Status: AC

## 2017-11-29 MED ORDER — OXYCODONE-ACETAMINOPHEN 5-325 MG PO TABS: 1.0000 | ORAL_TABLET | Freq: Four times a day (QID) | ORAL | 0 refills | Status: DC | PRN

## 2017-11-29 MED ORDER — NORETHINDRONE 0.35 MG PO TABS: 1.0000 | ORAL_TABLET | Freq: Every day | ORAL | 11 refills | Status: DC

## 2017-11-29 NOTE — Discharge Summary (Signed)
OB Discharge Summary     Patient Name: Hanny Elsberry DOB: 1986-01-24 MRN: [id number]  Date of admission: 11/27/2017 Delivering MD: Jaynie Collins A   Date of discharge: 11/29/2017  Admitting diagnosis: RCS Intrauterine pregnancy: [redacted]w[redacted]d     Secondary diagnosis:  Active Problems:   S/P cesarean section  Additional problems: None     Discharge diagnosis: Term Pregnancy Delivered                                                                                                Post partum procedures:none  Augmentation: n/a, scheduled C/S  Complications: None  Hospital course:  Sceduled C/S   32 y.o. yo [id number] at [redacted]w[redacted]d was admitted to the hospital 11/27/2017 for scheduled cesarean section with the following indication:Elective Repeat and Prior Uterine Surgery.  Membrane Rupture Time/Date: 12:47 PM ,11/27/2017   Patient delivered a Viable infant.11/27/2017  Details of operation can be found in separate operative note.  Pateint had an uncomplicated postpartum course.  She is ambulating, tolerating a regular diet, passing flatus, and urinating well. Patient is discharged home in stable condition on  11/29/17         Physical exam  Vitals:   11/28/17 0816 11/28/17 1158 11/28/17 1826 11/29/17 0553  BP: (!) 92/50 (!) 96/42 (!) 94/47 (!) 100/50  Pulse: 79 86 87 81  Resp: 18 18 18 18   Temp: 98.1 F (36.7 C) (!) 97.5 F (36.4 C) 98.5 F (36.9 C) 98.5 F (36.9 C)  TempSrc: Oral Oral Oral Oral  SpO2: 100% 100%  100%  Weight:      Height:       General: alert, cooperative and no distress Lochia: appropriate Uterine Fundus: firm Incision: Healing well with no significant drainage, Dressing is clean, dry, and intact DVT Evaluation: No evidence of DVT seen on physical exam. Negative Homan's sign. No cords or calf tenderness. No significant calf/ankle edema. Labs: Lab Results  Component Value Date   WBC 9.7 11/28/2017   HGB 8.9 (L) 11/28/2017   HCT 26.3 (L) 11/28/2017   MCV 91.0  11/28/2017   PLT 205 11/28/2017   CMP Latest Ref Rng & Units 11/28/2017  Creatinine 0.44 - 1.00 mg/dL 7.84    Discharge instruction: per After Visit Summary and "Baby and Me Booklet".  After visit meds:  Allergies as of 11/29/2017      Reactions   No Known Allergies       Medication List    TAKE these medications   albuterol 108 (90 Base) MCG/ACT inhaler Commonly known as:  PROVENTIL HFA;VENTOLIN HFA Inhale 2 puffs into the lungs every 6 (six) hours as needed for wheezing or shortness of breath.   calcium carbonate 500 MG chewable tablet Commonly known as:  TUMS - dosed in mg elemental calcium Chew 1 tablet by mouth 3 (three) times daily as needed for indigestion or heartburn.   ibuprofen 600 MG tablet Commonly known as:  ADVIL,MOTRIN Take 1 tablet (600 mg total) by mouth every 6 (six) hours.   norethindrone 0.35 MG tablet Commonly known as:  MICRONOR,CAMILA,ERRIN Take 1 tablet (0.35  mg total) by mouth daily. Start 2 weeks after the birth of your baby.   oxyCODONE-acetaminophen 5-325 MG tablet Commonly known as:  PERCOCET/ROXICET Take 1-2 tablets by mouth every 6 (six) hours as needed (pain scale > 7).   prenatal multivitamin Tabs tablet Take 1 tablet by mouth daily at 12 noon.       Diet: routine diet  Activity: Advance as tolerated. Pelvic rest for 6 weeks.   Outpatient follow up:2 weeks Follow up Appt: Future Appointments  Date Time Provider Department Center  12/13/2017  9:30 AM [facility] NURSE [facility]  01/15/2018  4:00 PM Pincus LargePhelps, Jazma Y, DO [facility]   Follow up Visit:No Follow-up on file.  Postpartum contraception: Progesterone only pills Discussed LARCs as most effective forms of birth control.  Discussed benefits/risks of other methods.  Pt desires POPs.  Discussed failure rates of OCPs/POPs with regular use.  Rx for Micronor sent to pharmacy to start 2-3 weeks postpartum as desired.  Newborn Data: Live born female  Birth Weight: 7 lb 5.5 oz (3331  g) APGAR: 8, 8  Newborn Delivery   Birth date/time:  11/27/2017 12:47:00 Delivery type:  C-Section, Low Transverse C-section categorization:  Repeat     Baby Feeding: Breast Disposition:home with mother   11/29/2017 Sharen CounterLisa Leftwich-Kirby, CNM

## 2017-12-13 ENCOUNTER — Ambulatory Visit: Payer: Medicaid Other | Admitting: Advanced Practice Midwife

## 2017-12-13 VITALS — BP 144/87 | HR 78 | Temp 99.1°F

## 2017-12-13 DIAGNOSIS — O165 Unspecified maternal hypertension, complicating the puerperium: Secondary | ICD-10-CM

## 2017-12-13 MED ORDER — AMLODIPINE BESYLATE 5 MG PO TABS: 5.0000 mg | ORAL_TABLET | Freq: Every day | ORAL | 0 refills | Status: DC

## 2017-12-13 NOTE — Progress Notes (Signed)
Here for incision check w/ incidental finding of HTN.  Denies HA, vision changes or epigastric pain.  Rx Norvasc 5 QD Pre-E labs and precautions.   BP check 1 week.   Katrinka BlazingSmith, [place], CNM 12/13/2017 10:30 AM

## 2017-12-14 LAB — COMPREHENSIVE METABOLIC PANEL
ALBUMIN: 3.5 g/dL (ref 3.5–5.5)
ALK PHOS: 113 IU/L (ref 39–117)
ALT: 15 IU/L (ref 0–32)
AST: 13 IU/L (ref 0–40)
Albumin/Globulin Ratio: 1.3 (ref 1.2–2.2)
BUN / CREAT RATIO: 11 (ref 9–23)
BUN: 9 mg/dL (ref 6–20)
Bilirubin Total: 0.2 mg/dL (ref 0.0–1.2)
CO2: 21 mmol/L (ref 20–29)
CREATININE: 0.84 mg/dL (ref 0.57–1.00)
Calcium: 9.3 mg/dL (ref 8.7–10.2)
Chloride: 110 mmol/L — ABNORMAL HIGH (ref 96–106)
GFR calc non Af Amer: 94 mL/min/{1.73_m2} (ref >59)
GFR, EST AFRICAN AMERICAN: 108 mL/min/{1.73_m2} (ref >59)
GLOBULIN, TOTAL: 2.8 g/dL (ref 1.5–4.5)
Glucose: 74 mg/dL (ref 65–99)
Potassium: 4 mmol/L (ref 3.5–5.2)
SODIUM: 146 mmol/L — AB (ref 134–144)
TOTAL PROTEIN: 6.3 g/dL (ref 6.0–8.5)

## 2017-12-14 LAB — CBC
HEMATOCRIT: 32.9 % — AB (ref 34.0–46.6)
HEMOGLOBIN: 10.8 g/dL — AB (ref 11.1–15.9)
MCH: 29.5 pg (ref 26.6–33.0)
MCHC: 32.8 g/dL (ref 31.5–35.7)
MCV: 90 fL (ref 79–97)
Platelets: 410 10*3/uL — ABNORMAL HIGH (ref 150–379)
RBC: 3.66 x10E6/uL — ABNORMAL LOW (ref 3.77–5.28)
RDW: 14.8 % (ref 12.3–15.4)
WBC: 8.7 10*3/uL (ref 3.4–10.8)

## 2017-12-14 LAB — PROTEIN / CREATININE RATIO, URINE
CREATININE, UR: 119.6 mg/dL
Protein, Ur: 10.6 mg/dL
Protein/Creat Ratio: 89 mg/g creat (ref 0–200)

## 2017-12-20 ENCOUNTER — Encounter: Payer: Self-pay | Admitting: General Practice

## 2017-12-20 ENCOUNTER — Ambulatory Visit: Payer: Medicaid Other

## 2017-12-20 ENCOUNTER — Ambulatory Visit: Payer: Self-pay

## 2017-12-20 VITALS — BP 132/82

## 2017-12-20 DIAGNOSIS — Z0130 Encounter for examination of blood pressure without abnormal findings: Secondary | ICD-10-CM

## 2017-12-20 NOTE — Progress Notes (Signed)
Subjective:  Amy Mcpherson is a 32 y.o. female here for BP check.   Hypertension ROS: not taking medications regularly as instructed, no medication side effects noted, no TIA's, no chest pain on exertion, no dyspnea on exertion and no swelling of ankles.    Objective:  There were no vitals taken for this visit.  Appearance alert, well appearing, and in no distress. General exam BP noted to be well controlled today in office.    Assessment:   Blood Pressure well controlled.   Plan:  Current treatment plan is effective, no change in therapy.

## 2017-12-20 NOTE — Progress Notes (Signed)
Patient had nurse visit appt today for BP check.  Patient brought baby to appt.  Called [facility] for patient to have blood pressure checked.  Patient voiced understanding.

## 2018-01-15 ENCOUNTER — Ambulatory Visit: Payer: Self-pay | Admitting: Obstetrics and Gynecology

## 2018-01-15 ENCOUNTER — Encounter: Payer: Self-pay | Admitting: Obstetrics and Gynecology

## 2018-01-15 ENCOUNTER — Ambulatory Visit (INDEPENDENT_AMBULATORY_CARE_PROVIDER_SITE_OTHER): Payer: Medicaid Other | Admitting: Obstetrics and Gynecology

## 2018-01-15 NOTE — Progress Notes (Signed)
Post Partum Exam  Amy Mcpherson is a 32 y.o. [phone] female who presents for a postpartum visit. She is 7 weeks postpartum following a low cervical transverse Cesarean section. I have fully reviewed the prenatal and intrapartum course. The delivery was at 39 gestational weeks.  Anesthesia: spinal. Postpartum course has been uncomplicated. Baby's course has been uncomplicated. Baby is feeding by breast. Bleeding no bleeding. Bowel function is normal. Bladder function is normal. Patient is sexually active. Contraception method is oral progesterone-only contraceptive. Postpartum depression screening:neg    Last pap smear done 03/2017 and was Normal  Review of Systems Pertinent items are noted in HPI.    Objective:  unknown if currently breastfeeding.  General:  alert, cooperative and no distress   Breasts:  inspection negative, no nipple discharge or bleeding, no masses or nodularity palpable  Lungs: clear to auscultation bilaterally  Heart:  regular rate and rhythm  Abdomen: soft, non-tender; bowel sounds normal; no masses,  no organomegaly  Incision: no erythema, induration or drainage. Healed well   Vulva:  not evaluated        Assessment:    Normal postpartum exam. Pap smear not done at today's visit.   Plan:   1. Contraception: oral progesterone-only contraceptive 2. Patient is medically cleared to resume all activities of daily living 3. Follow up in: 3 months for annual exam or as needed.

## 2018-05-14 ENCOUNTER — Encounter: Payer: Self-pay | Admitting: Obstetrics and Gynecology

## 2018-05-14 ENCOUNTER — Other Ambulatory Visit (HOSPITAL_COMMUNITY)
Admission: RE | Admit: 2018-05-14 | Discharge: 2018-05-14 | Disposition: A | Discharge status: Home / Self Care | Payer: Medicaid Other | Source: Ambulatory Visit | Attending: Obstetrics and Gynecology | Admitting: Obstetrics and Gynecology

## 2018-05-14 ENCOUNTER — Other Ambulatory Visit: Payer: Self-pay

## 2018-05-14 ENCOUNTER — Ambulatory Visit (INDEPENDENT_AMBULATORY_CARE_PROVIDER_SITE_OTHER): Payer: Medicaid Other | Admitting: Obstetrics and Gynecology

## 2018-05-14 VITALS — BP 126/81 | HR 84 | Ht 60.0 in | Wt 198.0 lb

## 2018-05-14 DIAGNOSIS — Z01419 Encounter for gynecological examination (general) (routine) without abnormal findings: Secondary | ICD-10-CM | POA: Insufficient documentation

## 2018-05-14 DIAGNOSIS — M549 Dorsalgia, unspecified: Secondary | ICD-10-CM | POA: Insufficient documentation

## 2018-05-14 DIAGNOSIS — B019 Varicella without complication: Secondary | ICD-10-CM | POA: Insufficient documentation

## 2018-05-14 DIAGNOSIS — Z0000 Encounter for general adult medical examination without abnormal findings: Secondary | ICD-10-CM | POA: Diagnosis not present

## 2018-05-14 NOTE — Progress Notes (Signed)
Subjective:     Amy Mcpherson is a 32 y.o. female G6P 3 with BMI 38 who is here for a comprehensive physical exam. The patient reports constipation associated with abdominal pain and nausea for the past 3 months. Patient stopped POP but is ready to restart. She is breastfeeding and has not resumed a regular period yet. Patient is without any other complaints  Past Medical History:  Diagnosis Date  . Asthma   . Depression    pre and post partum took medication it helped   Past Surgical History:  Procedure Laterality Date  . CESAREAN SECTION    . CESAREAN SECTION N/A 11/27/2017   Procedure: CESAREAN SECTION;  Surgeon: Tereso NewcomerAnyanwu, Ugonna A, MD;  Location: [facility];  Service: Obstetrics;  Laterality: N/A;  . LESION REMOVAL  2008   Family History  Problem Relation Age of Onset  . Heart disease Mother   . Hypertension Mother      Social History   Socioeconomic History  . Marital status: Single    Spouse name: Not on file  . Number of children: Not on file  . Years of education: Not on file  . Highest education level: Not on file  Occupational History  . Not on file  Social Needs  . Financial resource strain: Not on file  . Food insecurity:    Worry: Not on file    Inability: Not on file  . Transportation needs:    Medical: Not on file    Non-medical: Not on file  Tobacco Use  . Smoking status: Former Games developermoker  . Smokeless tobacco: Never Used  Substance and Sexual Activity  . Alcohol use: No  . Drug use: No  . Sexual activity: Not Currently  Lifestyle  . Physical activity:    Days per week: Not on file    Minutes per session: Not on file  . Stress: Not on file  Relationships  . Social connections:    Talks on phone: Not on file    Gets together: Not on file    Attends religious service: Not on file    Active member of club or organization: Not on file    Attends meetings of clubs or organizations: Not on file    Relationship status: Not on file  . Intimate  partner violence:    Fear of current or ex partner: Not on file    Emotionally abused: Not on file    Physically abused: Not on file    Forced sexual activity: Not on file  Other Topics Concern  . Not on file  Social History Narrative  . Not on file   Health Maintenance  Topic Date Due  . INFLUENZA VACCINE  04/26/2018  . PAP SMEAR  04/20/2020  . TETANUS/TDAP  09/07/2027  . HIV Screening  Completed       Review of Systems Pertinent items are noted in HPI.   Objective:  Blood pressure 126/81, pulse 84, height 5' (1.524 m), weight 198 lb (89.8 kg), currently breastfeeding.     GENERAL: Well-developed, well-nourished female in no acute distress.  HEENT: Normocephalic, atraumatic. Sclerae anicteric.  NECK: Supple. Normal thyroid.  LUNGS: Clear to auscultation bilaterally.  HEART: Regular rate and rhythm. BREASTS: Symmetric in size. No palpable masses or lymphadenopathy, skin changes, or nipple drainage. ABDOMEN: Soft, nontender, nondistended. No organomegaly. PELVIC: Normal external female genitalia. Vagina is pink and rugated.  Normal discharge. Normal appearing cervix. Uterus is normal in size. No adnexal mass or tenderness.  EXTREMITIES: No cyanosis, clubbing, or edema, 2+ distal pulses.    Assessment:    Healthy female exam.      Plan:    pap smear collected Patient will be contacted with abnormal results Patient to restart micronor Advised patient to increase water and fiber intake, take stool softener to help alleviate constipation RTC prn See After Visit Summary for Counseling Recommendations

## 2018-05-16 LAB — CYTOLOGY - PAP
Chlamydia: NEGATIVE
DIAGNOSIS: NEGATIVE
HPV (WINDOPATH): NOT DETECTED
Neisseria Gonorrhea: NEGATIVE

## 2018-07-16 ENCOUNTER — Ambulatory Visit (HOSPITAL_COMMUNITY)
Admission: EM | Admit: 2018-07-16 | Discharge: 2018-07-16 | Disposition: A | Discharge status: Home / Self Care | Payer: Medicaid Other | Attending: Family Medicine | Admitting: Family Medicine

## 2018-07-16 ENCOUNTER — Encounter (HOSPITAL_COMMUNITY): Payer: Self-pay | Admitting: Emergency Medicine

## 2018-07-16 DIAGNOSIS — B9789 Other viral agents as the cause of diseases classified elsewhere: Secondary | ICD-10-CM

## 2018-07-16 DIAGNOSIS — J069 Acute upper respiratory infection, unspecified: Secondary | ICD-10-CM | POA: Diagnosis not present
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
## 2018-07-16 NOTE — Discharge Instructions (Signed)
May continue OTC cough medicine like Delsym Drink plenty of fluids Run a humidifier if you have one

## 2018-07-16 NOTE — ED Triage Notes (Signed)
Pt c/o cough, congestion, sore throat x4 days.

## 2018-07-17 ENCOUNTER — Telehealth (HOSPITAL_COMMUNITY): Payer: Self-pay | Admitting: Family Medicine

## 2018-07-17 MED ORDER — BENZONATATE 100 MG PO CAPS: 100.0000 mg | ORAL_CAPSULE | Freq: Three times a day (TID) | ORAL | 0 refills | Status: AC

## 2018-07-17 NOTE — Telephone Encounter (Signed)
PT was under the impression that our doctor was sending something for cough to her pharmacy. Her AVS recommends that she take OTC delsym. PT requests med be sent to pharmacy. Dr. Tracie Harrier gives order for tessalon to be sent to pharmacy of choice.

## 2019-04-12 IMAGING — US US MFM OB LIMITED
1 series · 15 of 25 positions shown · non-contrast
Comparison: none

[Series 1: us mfm ob limited · 15 of 25 slices shown]
[im 1/25]
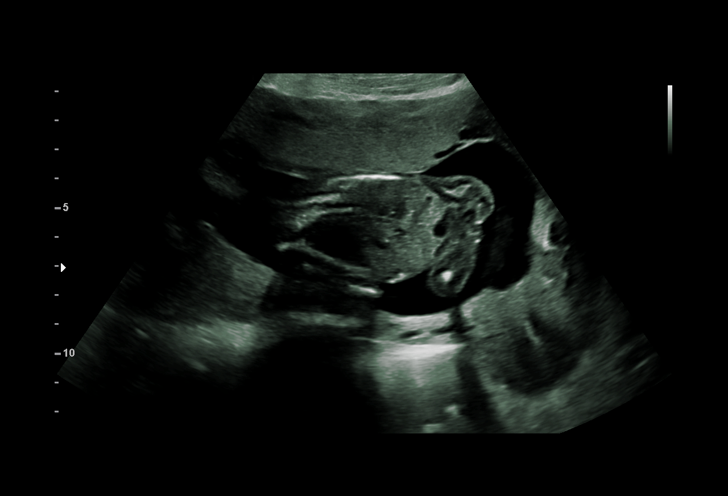
[im 3/25]
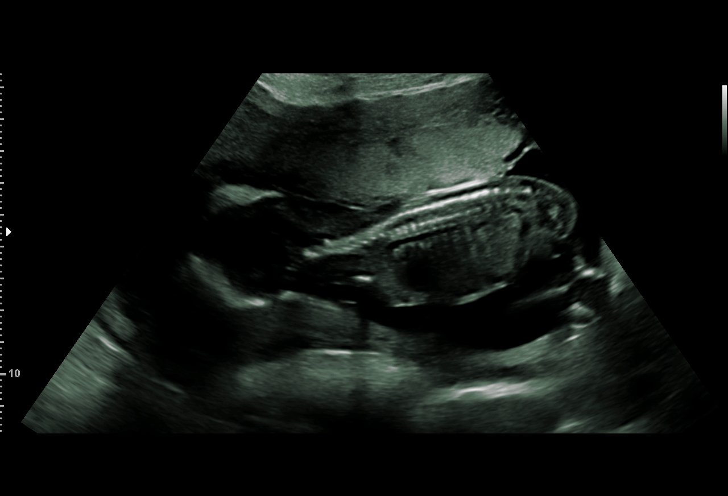
[im 5/25]
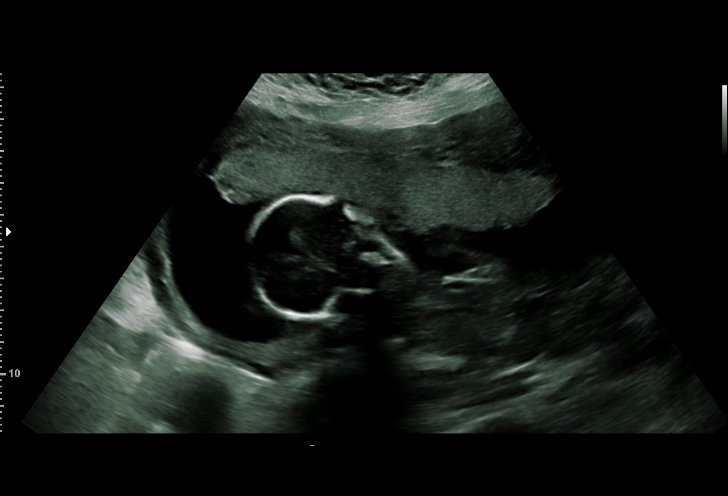
[im 6/25]
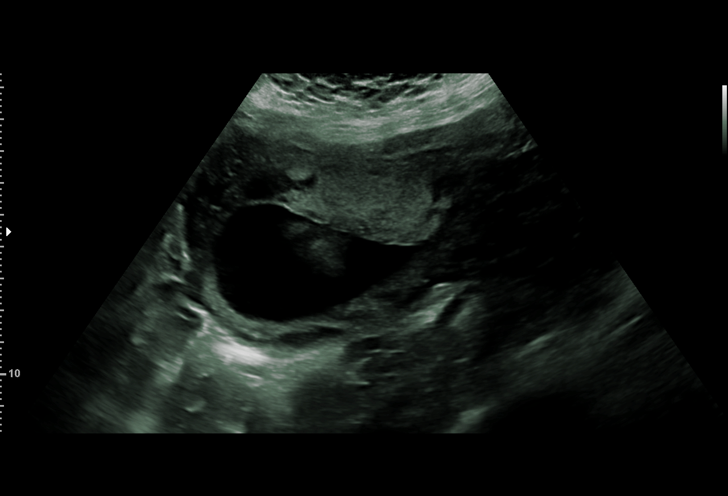
[im 8/25]
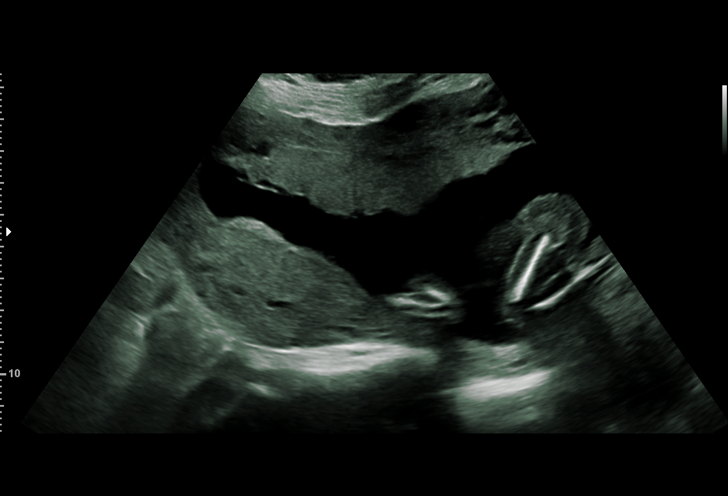
[im 10/25]
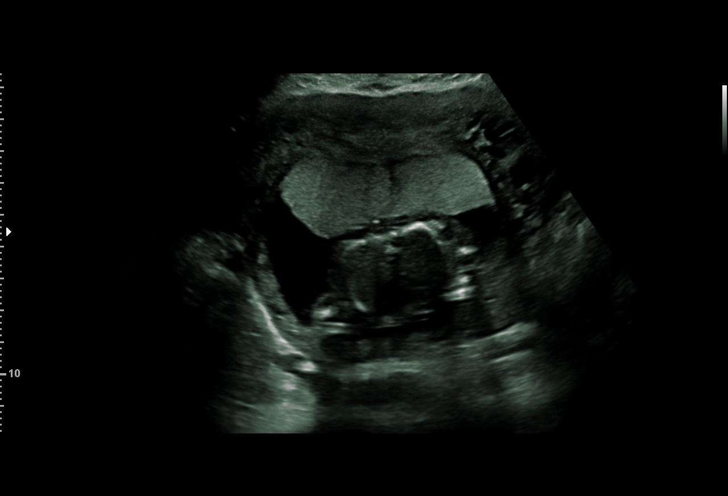
[im 11/25]
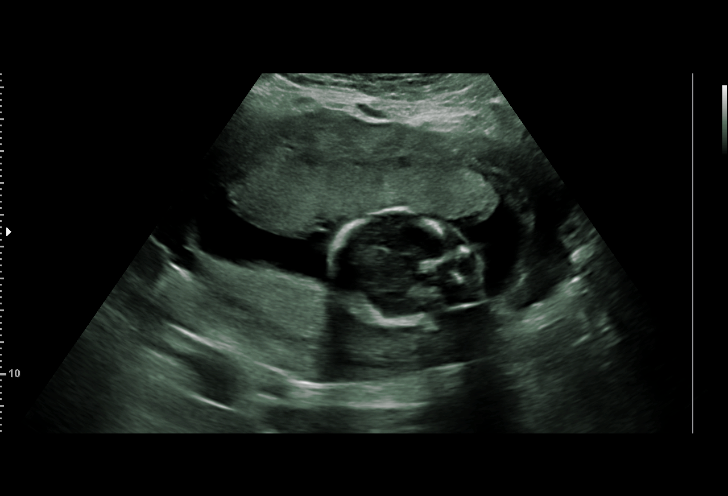
[im 13/25]
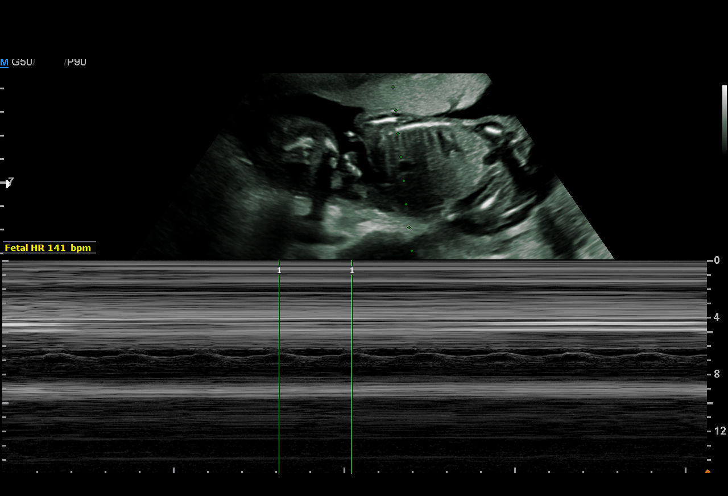
[im 15/25]
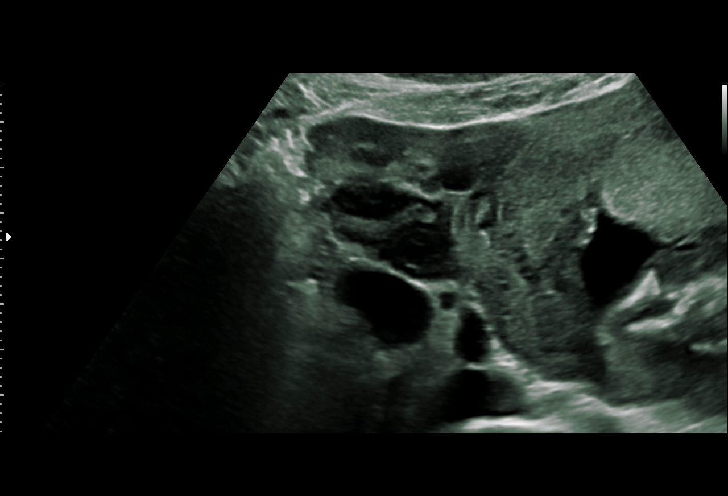
[im 16/25]
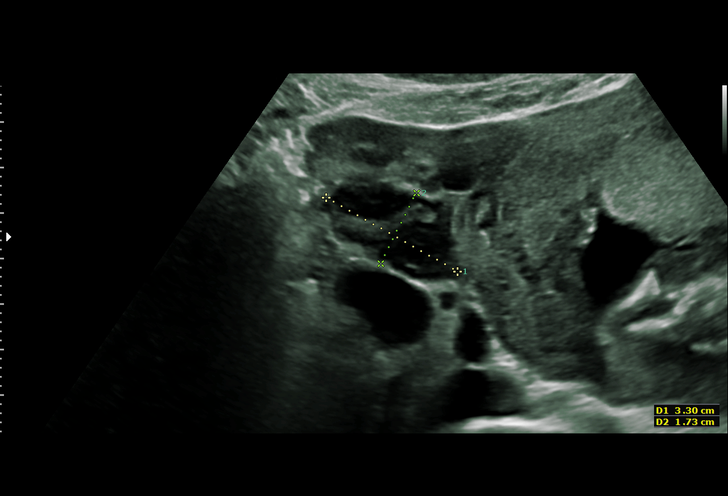
[im 18/25]
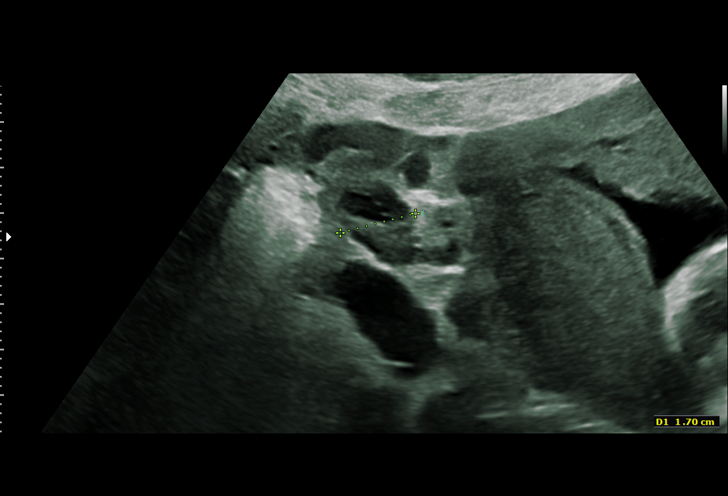
[im 20/25]
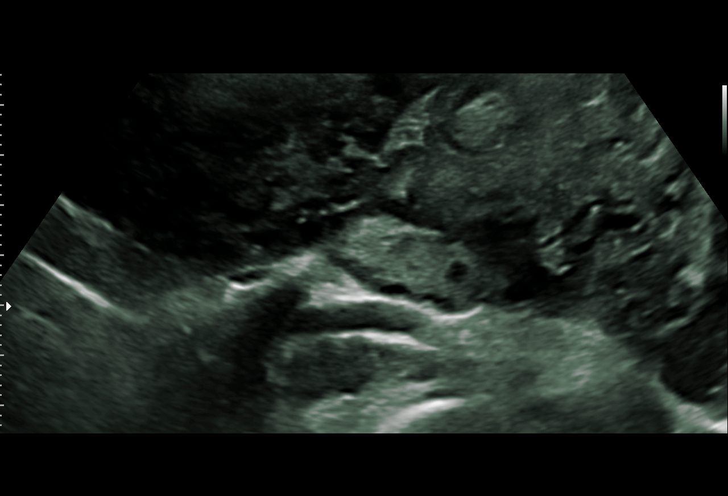
[im 21/25]
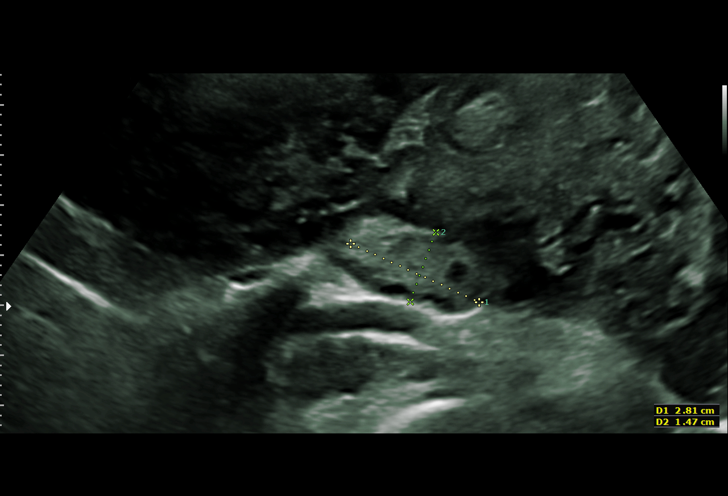
[im 23/25]
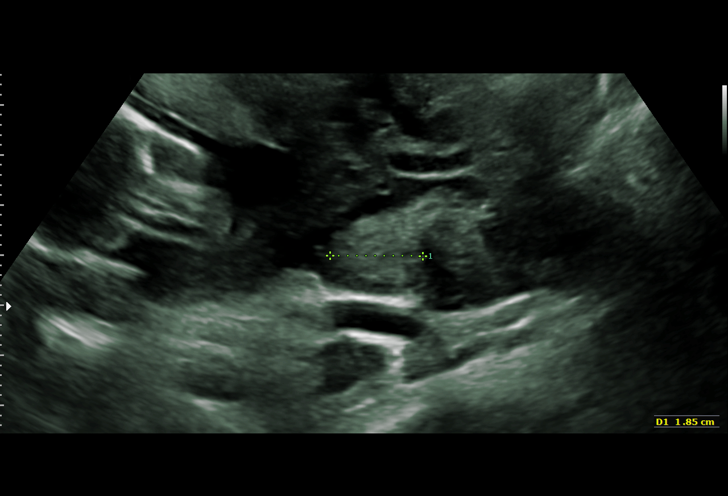
[im 25/25]
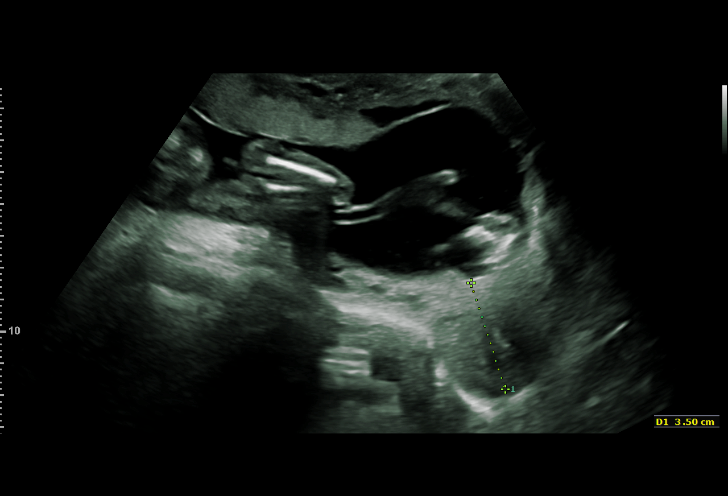

[15 of 25 positions shown; findings below may reference images not displayed]

1  MEECHIE SIGMON             [id number]      [id number]     [id number]
Indications

16 weeks gestation of pregnancy
OB History

Gravidity:    6         Term:   2         SAB:   1
TOP:          2         Living: 2
Fetal Evaluation

Num Of Fetuses:     1
Fetal Heart         141
Rate(bpm):
Cardiac Activity:   Observed
Presentation:       Breech
Placenta:           Anterior, above cervical os

Amniotic Fluid
AFI FV:      Subjectively within normal limits

Largest Pocket(cm)
5.7

Comment:    No placental abruption identified.
Gestational Age

Clinical EDD:  16w 6d                                        EDD:   12/03/17
Best:          16w 6d     Det. By:  Clinical EDD             EDD:   12/03/17
Cervix Uterus Adnexa

Cervix
Length:            3.5  cm.
Normal appearance by transabdominal scan.
Left Ovary
Within normal limits.

Right Ovary
Within normal limits.
Impression

SIUP at 12w2d
active singleton fetus
breech presentation
no previa or retrolplacental clot demonstrated
cervix is long and closed measuring 3.5cm
Recommendations

Follow up as clinically indicated.

## 2019-05-09 IMAGING — US US MFM OB DETAIL+14 WK
1 series · 14 of 28 positions shown · non-contrast
Comparison: none

[Series 1: us mfm ob detail+14 wk · 14 of 90 slices shown]
[im 4/90]
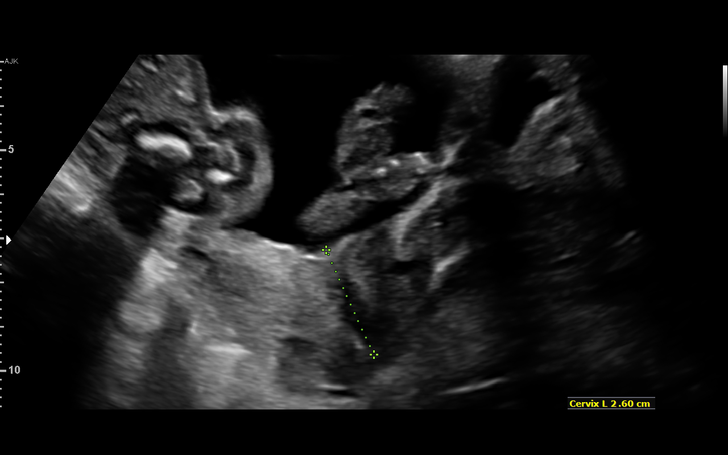
[im 10/90]
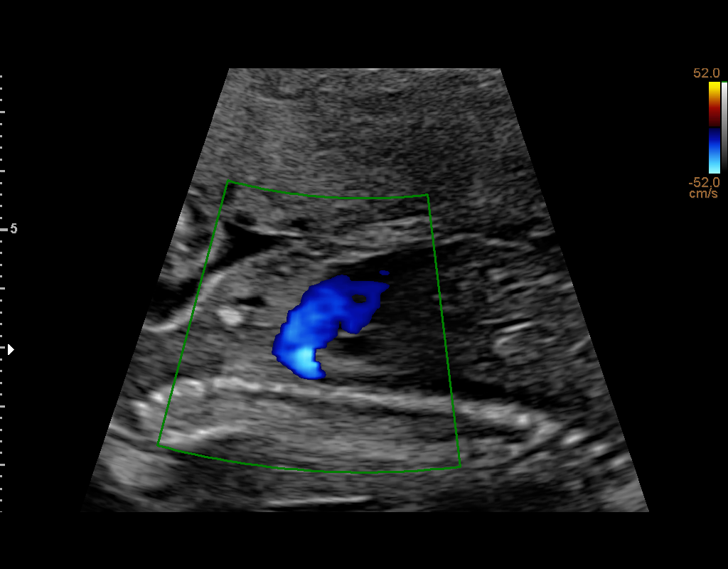
[im 17/90]
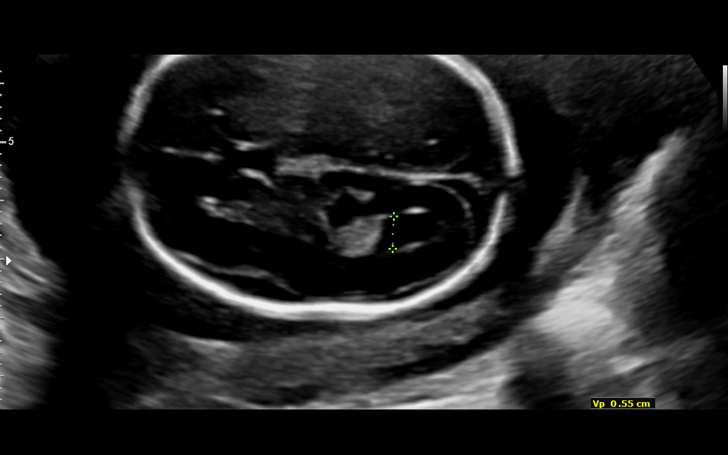
[im 24/90]
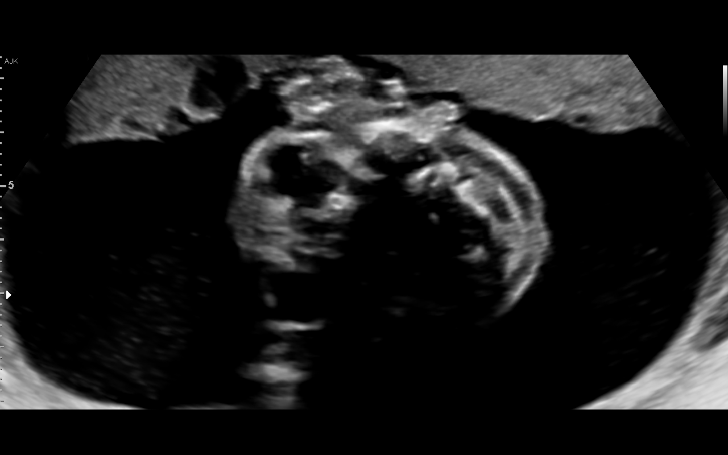
[im 30/90]
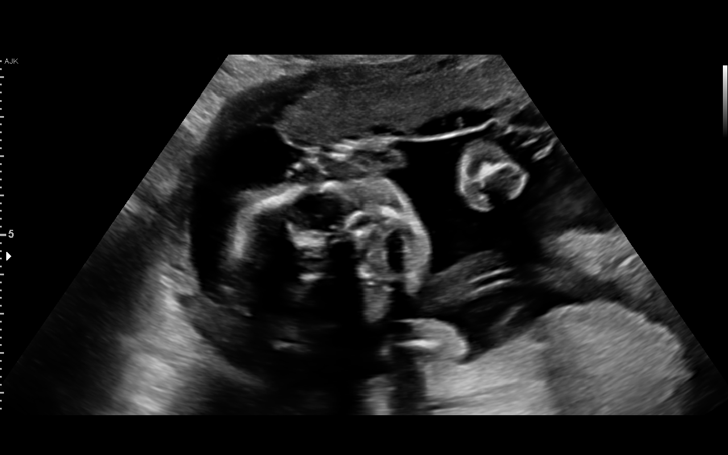
[im 37/90]
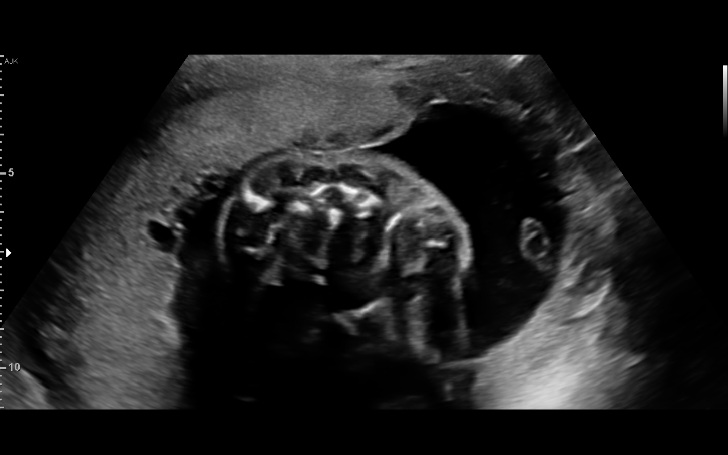
[im 43/90]
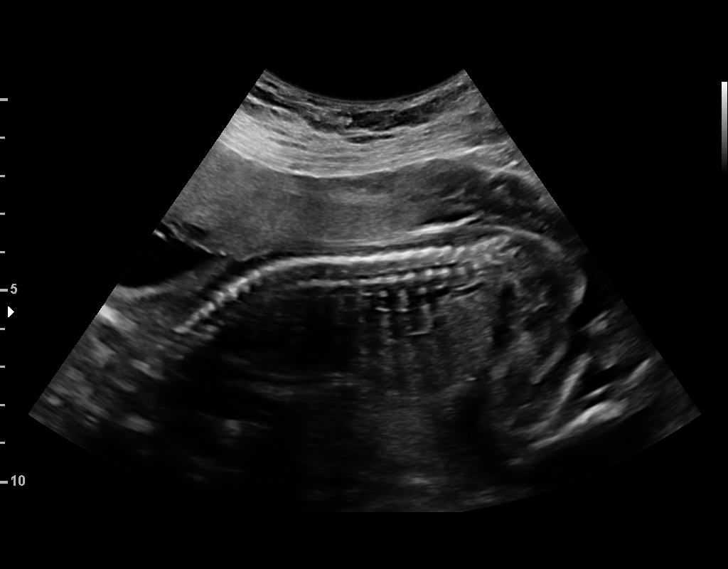
[im 50/90]
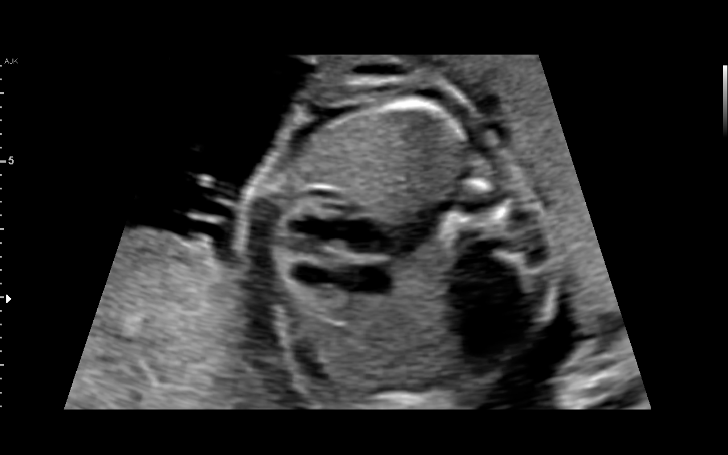
[im 57/90]
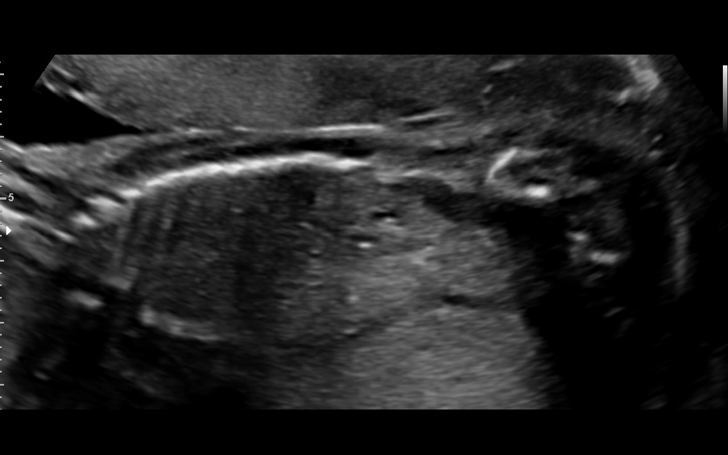
[im 63/90]
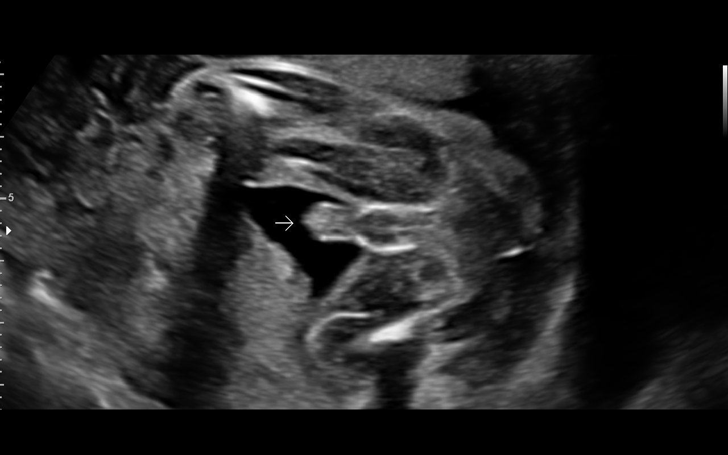
[im 70/90]
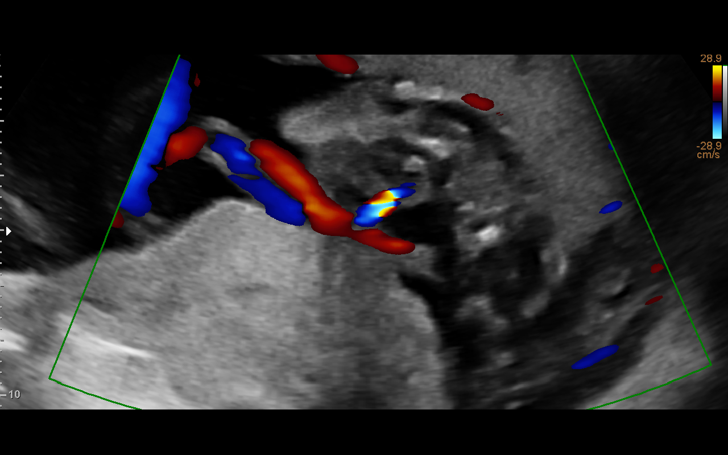
[im 76/90]
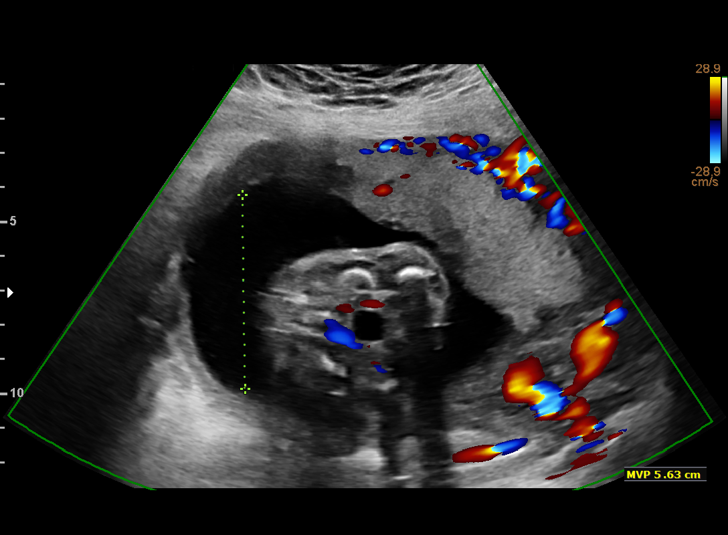
[im 83/90]
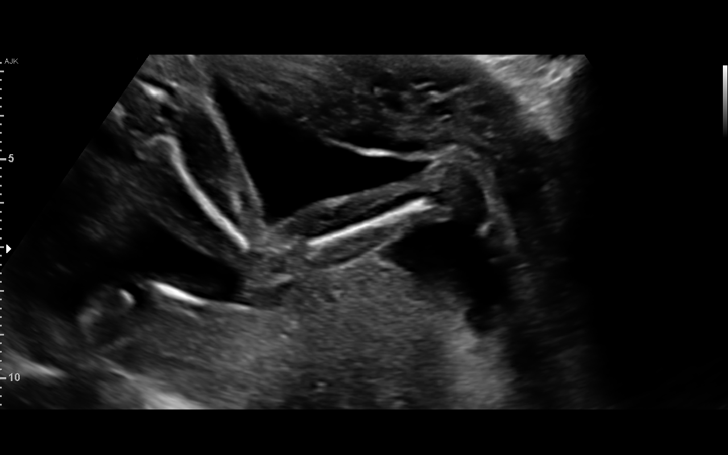
[im 90/90]
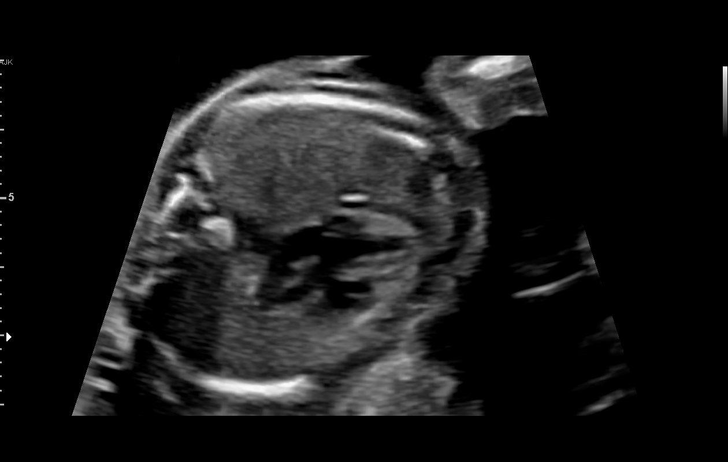

[14 of 28 positions shown; findings below may reference images not displayed]

[facility] [HOSP[facility]]

1  ZAHARUDIN WINN         [id number]      [id number]     [id number]
Indications

20 weeks gestation of pregnancy
Encounter for fetal anatomic survey
History of cesarean delivery, currently
pregnant
OB History

Gravidity:    6         Term:   2         SAB:   1
TOP:          2        Living:  2
Fetal Evaluation

Num Of Fetuses:     1
Fetal Heart         136
Rate(bpm):
Cardiac Activity:   Observed
Presentation:       Breech
Placenta:           Anterior, above cervical os
P. Cord Insertion:  Visualized, central

Amniotic Fluid
AFI FV:      Subjectively within normal limits

Largest Pocket(cm)
5.63
Biometry

BPD:      49.3  mm     G. Age:  20w 6d         58  %    CI:        68.02   %    70 - 86
FL/HC:      17.7   %    15.9 -
HC:      191.2  mm     G. Age:  21w 3d         71  %    HC/AC:      1.15        1.06 -
AC:      166.4  mm     G. Age:  21w 4d         74  %    FL/BPD:     68.8   %
FL:       33.9  mm     G. Age:  20w 5d         39  %    FL/AC:      20.4   %    20 - 24
CER:      22.1  mm     G. Age:  21w 0d         56  %
NFT:       5.7  mm
CM:        3.8  mm

Est. FW:     407  gm    0 lb 14 oz      53  %
Gestational Age

Clinical EDD:  20w 5d                                        EDD:   12/03/17
U/S Today:     21w 1d                                        EDD:   11/30/17
Best:          20w 5d     Det. By:  Clinical EDD             EDD:   12/03/17
Anatomy

Cranium:               Appears normal         Aortic Arch:            Appears normal
Cavum:                 Appears normal         Ductal Arch:            Appears normal
Ventricles:            Appears normal         Diaphragm:              Appears normal
Choroid Plexus:        Appears normal         Stomach:                Appears normal, left
sided
Cerebellum:            Appears normal         Abdomen:                Appears normal
Posterior Fossa:       Appears normal         Abdominal Wall:         Appears nml (cord
insert, abd wall)
Nuchal Fold:           Appears normal         Cord Vessels:           Appears normal (3
vessel cord)
Face:                  Appears normal         Kidneys:                Appear normal
(orbits and profile)
Lips:                  Appears normal         Bladder:                Appears normal
Thoracic:              Appears normal         Spine:                  Appears normal
Heart:                 Appears normal         Upper Extremities:      Appears normal
(4CH, axis, and
situs)
RVOT:                  Appears normal         Lower Extremities:      Appears normal
LVOT:                  Appears normal

Other:  Fetus appears to be a male. Heels and LT 5th digit visualized.
Technically difficult due to fetal position.
Cervix Uterus Adnexa

Cervix
Length:           3.03  cm.
Normal appearance by transabdominal scan.

Uterus
No abnormality visualized.

Left Ovary
Within normal limits.

Right Ovary
Within normal limits.

Adnexa:       No abnormality visualized.
Impression

Singleton intrauterine pregnancy at 20 weeks 5 days
gestation with fetal cardiac activity
Breech presentation
Anterior placenta
Normal appearing fetal growth and amniotic fluid volume
No apparent birth defects noted on fetal anatomic survey
Normal appearing cervical length
Recommendations

Follow up as clinically indicated.

## 2020-11-24 ENCOUNTER — Telehealth: Payer: Self-pay | Admitting: *Deleted

## 2020-11-24 NOTE — Telephone Encounter (Signed)
Tried calling patient x2 to inform her the need to fill out a records release for records. No answer, no vm set up

## 2020-11-24 NOTE — Telephone Encounter (Signed)
Pt left VM message stating that she is would like to get her medical records. She requests a call back.
